# Patient Record
Sex: Female | Born: 1985 | Race: Black or African American | Hispanic: No | Marital: Single | Smoking: Never smoker
Health system: Southern US, Community
[De-identification: ages and names within clinical notes are randomized; demographics above are authoritative.]

## PROBLEM LIST (undated history)

## (undated) DIAGNOSIS — N12 Tubulo-interstitial nephritis, not specified as acute or chronic: Secondary | ICD-10-CM

## (undated) DIAGNOSIS — M67439 Ganglion, unspecified wrist: Secondary | ICD-10-CM

## (undated) DIAGNOSIS — E46 Unspecified protein-calorie malnutrition: Secondary | ICD-10-CM

## (undated) DIAGNOSIS — N179 Acute kidney failure, unspecified: Secondary | ICD-10-CM

## (undated) DIAGNOSIS — J90 Pleural effusion, not elsewhere classified: Secondary | ICD-10-CM

## (undated) DIAGNOSIS — D696 Thrombocytopenia, unspecified: Secondary | ICD-10-CM

---
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
## 2009-03-25 ENCOUNTER — Encounter: Admission: RE | Admit: 2009-03-25 | Discharge: 2009-03-25 | Payer: Self-pay | Admitting: Family Medicine

## 2009-08-18 ENCOUNTER — Encounter: Admission: RE | Admit: 2009-08-18 | Discharge: 2009-08-18 | Payer: Self-pay | Admitting: Family Medicine

## 2011-03-17 DIAGNOSIS — M67439 Ganglion, unspecified wrist: Secondary | ICD-10-CM

## 2011-03-17 HISTORY — DX: Ganglion, unspecified wrist: M67.439

## 2011-04-13 ENCOUNTER — Other Ambulatory Visit: Payer: Self-pay | Admitting: Orthopedic Surgery

## 2011-04-13 ENCOUNTER — Encounter (HOSPITAL_BASED_OUTPATIENT_CLINIC_OR_DEPARTMENT_OTHER): Payer: Self-pay | Admitting: *Deleted

## 2011-04-20 ENCOUNTER — Encounter (HOSPITAL_BASED_OUTPATIENT_CLINIC_OR_DEPARTMENT_OTHER): Payer: Self-pay | Admitting: *Deleted

## 2011-04-20 ENCOUNTER — Encounter (HOSPITAL_BASED_OUTPATIENT_CLINIC_OR_DEPARTMENT_OTHER): Payer: Self-pay | Admitting: Orthopedic Surgery

## 2011-04-20 ENCOUNTER — Encounter (HOSPITAL_BASED_OUTPATIENT_CLINIC_OR_DEPARTMENT_OTHER): Payer: Self-pay | Admitting: Anesthesiology

## 2011-04-20 ENCOUNTER — Encounter (HOSPITAL_BASED_OUTPATIENT_CLINIC_OR_DEPARTMENT_OTHER)
Admission: RE | Disposition: A | Discharge status: Home / Self Care | Payer: Self-pay | Source: Ambulatory Visit | Attending: Orthopedic Surgery

## 2011-04-20 ENCOUNTER — Ambulatory Visit (HOSPITAL_BASED_OUTPATIENT_CLINIC_OR_DEPARTMENT_OTHER): Payer: BC Managed Care – PPO | Admitting: Anesthesiology

## 2011-04-20 ENCOUNTER — Ambulatory Visit (HOSPITAL_BASED_OUTPATIENT_CLINIC_OR_DEPARTMENT_OTHER)
Admission: RE | Admit: 2011-04-20 | Discharge: 2011-04-20 | Disposition: A | Discharge status: Home / Self Care | Payer: BC Managed Care – PPO | Source: Ambulatory Visit | Attending: Orthopedic Surgery | Admitting: Orthopedic Surgery

## 2011-04-20 DIAGNOSIS — M6740 Ganglion, unspecified site: Secondary | ICD-10-CM | POA: Insufficient documentation

## 2011-04-20 HISTORY — PX: GANGLION CYST EXCISION: SHX1691

## 2011-04-20 HISTORY — DX: Ganglion, unspecified wrist: M67.439

## 2011-04-20 LAB — POCT HEMOGLOBIN-HEMACUE: Hemoglobin: 12.2 g/dL (ref 12.0–15.0)

## 2011-04-20 SURGERY — EXCISION, GANGLION CYST, WRIST
Anesthesia: General | Site: Wrist | Laterality: Right | Wound class: Clean

## 2011-04-20 MED ORDER — LACTATED RINGERS IV SOLN
INTRAVENOUS | Status: DC
  Administered 2011-04-20 (×2): via INTRAVENOUS

## 2011-04-20 MED ORDER — PROPOFOL 10 MG/ML IV EMUL
INTRAVENOUS | Status: DC | PRN
  Administered 2011-04-20: 200 mg via INTRAVENOUS

## 2011-04-20 MED ORDER — ONDANSETRON HCL 4 MG/2ML IJ SOLN
INTRAMUSCULAR | Status: DC | PRN
  Administered 2011-04-20: 4 mg via INTRAVENOUS

## 2011-04-20 MED ORDER — CHLORHEXIDINE GLUCONATE 4 % EX LIQD: 60.0000 mL | Freq: Once | CUTANEOUS | Status: DC

## 2011-04-20 MED ORDER — MIDAZOLAM HCL 5 MG/5ML IJ SOLN
INTRAMUSCULAR | Status: DC | PRN
  Administered 2011-04-20: 2 mg via INTRAVENOUS

## 2011-04-20 MED ORDER — LIDOCAINE HCL (CARDIAC) 20 MG/ML IV SOLN
INTRAVENOUS | Status: DC | PRN
  Administered 2011-04-20: 50 mg via INTRAVENOUS

## 2011-04-20 MED ORDER — HYDROMORPHONE HCL PF 1 MG/ML IJ SOLN
0.2500 mg | INTRAMUSCULAR | Status: DC | PRN
  Administered 2011-04-20 (×3): 0.5 mg via INTRAVENOUS

## 2011-04-20 MED ORDER — FENTANYL CITRATE 0.05 MG/ML IJ SOLN
INTRAMUSCULAR | Status: DC | PRN
  Administered 2011-04-20 (×2): 50 ug via INTRAVENOUS

## 2011-04-20 MED ORDER — HYDROCODONE-ACETAMINOPHEN 5-325 MG PO TABS: ORAL_TABLET | ORAL | Status: AC

## 2011-04-20 MED ORDER — LIDOCAINE HCL 2 % IJ SOLN
INTRAMUSCULAR | Status: DC | PRN
  Administered 2011-04-20: 5 mL

## 2011-04-20 MED ORDER — DEXAMETHASONE SODIUM PHOSPHATE 4 MG/ML IJ SOLN
INTRAMUSCULAR | Status: DC | PRN
  Administered 2011-04-20: 10 mg via INTRAVENOUS

## 2011-04-20 MED ORDER — ONDANSETRON HCL 4 MG/2ML IJ SOLN: 4.0000 mg | Freq: Once | INTRAMUSCULAR | Status: DC | PRN

## 2011-04-20 SURGICAL SUPPLY — 48 items
BANDAGE ADHESIVE 1X3 (GAUZE/BANDAGES/DRESSINGS) IMPLANT
BANDAGE ELASTIC 3 VELCRO ST LF (GAUZE/BANDAGES/DRESSINGS) ×2 IMPLANT
BANDAGE GAUZE ELAST BULKY 4 IN (GAUZE/BANDAGES/DRESSINGS) IMPLANT
BLADE MINI RND TIP GREEN BEAV (BLADE) IMPLANT
BLADE SURG 15 STRL LF DISP TIS (BLADE) ×1 IMPLANT
BLADE SURG 15 STRL SS (BLADE) ×1
BNDG ESMARK 4X9 LF (GAUZE/BANDAGES/DRESSINGS) ×2 IMPLANT
BRUSH SCRUB EZ PLAIN DRY (MISCELLANEOUS) ×2 IMPLANT
CLOTH BEACON ORANGE TIMEOUT ST (SAFETY) ×2 IMPLANT
CORDS BIPOLAR (ELECTRODE) ×2 IMPLANT
COVER MAYO STAND STRL (DRAPES) ×2 IMPLANT
COVER TABLE BACK 60X90 (DRAPES) ×2 IMPLANT
CUFF TOURNIQUET SINGLE 18IN (TOURNIQUET CUFF) ×2 IMPLANT
DECANTER SPIKE VIAL GLASS SM (MISCELLANEOUS) IMPLANT
DRAPE EXTREMITY T 121X128X90 (DRAPE) ×2 IMPLANT
DRAPE SURG 17X23 STRL (DRAPES) ×2 IMPLANT
GAUZE SPONGE 4X4 12PLY STRL LF (GAUZE/BANDAGES/DRESSINGS) ×2 IMPLANT
GLOVE BIOGEL M STRL SZ7.5 (GLOVE) ×2 IMPLANT
GLOVE BIOGEL PI IND STRL 7.0 (GLOVE) ×1 IMPLANT
GLOVE BIOGEL PI INDICATOR 7.0 (GLOVE) ×1
GLOVE ECLIPSE 6.5 STRL STRAW (GLOVE) ×2 IMPLANT
GLOVE ORTHO TXT STRL SZ7.5 (GLOVE) ×2 IMPLANT
GLOVE SKINSENSE NS SZ6.5 (GLOVE) ×1
GLOVE SKINSENSE STRL SZ6.5 (GLOVE) ×1 IMPLANT
GOWN PREVENTION PLUS XLARGE (GOWN DISPOSABLE) ×4 IMPLANT
GOWN PREVENTION PLUS XXLARGE (GOWN DISPOSABLE) ×4 IMPLANT
NEEDLE 27GAX1X1/2 (NEEDLE) ×2 IMPLANT
PACK BASIN DAY SURGERY FS (CUSTOM PROCEDURE TRAY) ×2 IMPLANT
PAD CAST 3X4 CTTN HI CHSV (CAST SUPPLIES) ×1 IMPLANT
PAD CAST 4YDX4 CTTN HI CHSV (CAST SUPPLIES) ×1 IMPLANT
PADDING CAST ABS 4INX4YD NS (CAST SUPPLIES) ×1
PADDING CAST ABS COTTON 4X4 ST (CAST SUPPLIES) ×1 IMPLANT
PADDING CAST COTTON 3X4 STRL (CAST SUPPLIES) ×1
PADDING CAST COTTON 4X4 STRL (CAST SUPPLIES) ×1
SPLINT PLASTER CAST XFAST 3X15 (CAST SUPPLIES) ×4 IMPLANT
SPLINT PLASTER XTRA FASTSET 3X (CAST SUPPLIES) ×4
SPONGE GAUZE 4X4 12PLY (GAUZE/BANDAGES/DRESSINGS) ×2 IMPLANT
STOCKINETTE 4X48 STRL (DRAPES) ×2 IMPLANT
STRIP CLOSURE SKIN 1/2X4 (GAUZE/BANDAGES/DRESSINGS) ×2 IMPLANT
SUT PROLENE 3 0 PS 2 (SUTURE) ×2 IMPLANT
SUT VIC AB 4-0 P-3 18XBRD (SUTURE) ×1 IMPLANT
SUT VIC AB 4-0 P3 18 (SUTURE) ×1
SYR 3ML 23GX1 SAFETY (SYRINGE) IMPLANT
SYR CONTROL 10ML LL (SYRINGE) ×2 IMPLANT
TOWEL OR 17X24 6PK STRL BLUE (TOWEL DISPOSABLE) ×4 IMPLANT
TRAY DSU PREP LF (CUSTOM PROCEDURE TRAY) ×2 IMPLANT
UNDERPAD 30X30 INCONTINENT (UNDERPADS AND DIAPERS) ×2 IMPLANT
WATER STERILE IRR 1000ML POUR (IV SOLUTION) ×2 IMPLANT

## 2011-04-20 NOTE — Brief Op Note (Signed)
04/20/2011  11:45 AM  PATIENT:  Danielle [name]  26 y.o. female  PRE-OPERATIVE DIAGNOSIS:  right dorsal ganglion cyst  POST-OPERATIVE DIAGNOSIS:  right dorsal ganglion cyst  PROCEDURE: REMOVAL OF MYXOID CYST OF RIGHT DORSAL WRIST WITH DEBRIDEMENT OF SCAPHOLUNATE LIGAMENT  SURGEON:  Wyn Forster., MD   PHYSICIAN ASSISTANT:   ASSISTANTS: Mallory Shirk.A-C    ANESTHESIA:   general  EBL:  Total I/O In: 1000 [I.V.:1000] Out: -   BLOOD ADMINISTERED:none  DRAINS: none   LOCAL MEDICATIONS USED:  XYLOCAINE   SPECIMEN:  none  DISPOSITION OF SPECIMEN:  N/A  COUNTS:  YES  TOURNIQUET:  * Missing tourniquet times found for documented tourniquets in log:  [id number] *  DICTATION: .Other Dictation: Dictation Number [id number]  PLAN OF CARE: Discharge to home after PACU  PATIENT DISPOSITION:  PACU - hemodynamically stable.

## 2011-04-20 NOTE — Discharge Instructions (Signed)

## 2011-04-20 NOTE — Op Note (Signed)
Op note dictated: 04/20/11 [id number]

## 2011-04-20 NOTE — Anesthesia Postprocedure Evaluation (Signed)
Anesthesia Post Note  Patient: Danielle [name]  Procedure(s) Performed: Procedure(s) (LRB): REMOVAL GANGLION OF WRIST (Right)  Anesthesia type: general  Patient location: PACU  Post pain: Pain level controlled  Post assessment: Patient's Cardiovascular Status Stable  Last Vitals:  Filed Vitals:   04/20/11 1300  BP: 102/66  Pulse: 74  Temp:   Resp: 12    Post vital signs: Reviewed and stable  Level of consciousness: sedated  Complications: No apparent anesthesia complications

## 2011-04-20 NOTE — Transfer of Care (Signed)
Immediate Anesthesia Transfer of Care Note  Patient: Danielle [name]  Procedure(s) Performed: Procedure(s) (LRB): REMOVAL GANGLION OF WRIST (Right)  Patient Location: PACU  Anesthesia Type: General  Level of Consciousness: awake and alert   Airway & Oxygen Therapy: Patient Spontanous Breathing and Patient connected to face mask oxygen  Post-op Assessment: Report given to PACU RN and Post -op Vital signs reviewed and stable  Post vital signs: Reviewed and stable  Complications: No apparent anesthesia complications

## 2011-04-20 NOTE — Anesthesia Preprocedure Evaluation (Signed)
Anesthesia Evaluation  Patient identified by MRN, date of birth, ID band Patient awake    Reviewed: Allergy & Precautions, H&P , NPO status , Patient's Chart, lab work & pertinent test results  Airway Mallampati: I TM Distance: >3 FB Neck ROM: full    Dental   Pulmonary          Cardiovascular     Neuro/Psych    GI/Hepatic   Endo/Other    Renal/GU      Musculoskeletal   Abdominal   Peds  Hematology   Anesthesia Other Findings   Reproductive/Obstetrics                          Anesthesia Physical Anesthesia Plan  ASA: I  Anesthesia Plan: General LMA   Post-op Pain Management:    Induction:   Airway Management Planned:   Additional Equipment:   Intra-op Plan:   Post-operative Plan:   Informed Consent: I have reviewed the patients History and Physical, chart, labs and discussed the procedure including the risks, benefits and alternatives for the proposed anesthesia with the patient or authorized representative who has indicated his/her understanding and acceptance.     Plan Discussed with: CRNA and Surgeon  Anesthesia Plan Comments:         Anesthesia Quick Evaluation  

## 2011-04-20 NOTE — H&P (Signed)
  Danielle [name] is an 26 y.o. female.   Chief Complaint: c/o mass dorsal aspect right wrist. HPI: Danielle [name] is a 26 year-old Danielle [name] at [employer].  She is left-hand dominant.  For four years she has had the mass on the dorsal aspect of her right wrist overlying her scapholunate region. 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  She now seeks an upper extremity orthopaedic consult due to mechanical symptoms at her cyst.    Past Medical History  Diagnosis Date  . Dorsal wrist ganglion 03/2011    right    History reviewed. No pertinent past surgical history.  History reviewed. No pertinent family history. Social History:  reports that she has never smoked. She has never used smokeless tobacco. She reports that she drinks alcohol. She reports that she does not use illicit drugs.  Allergies: No Known Allergies  No current facility-administered medications on file as of .   No current outpatient prescriptions on file as of .    No results found for this or any previous visit (from the past 48 hour(s)).  No results found.   Pertinent items are noted in HPI.  Height 6' (1.829 m), weight 61.236 kg (135 lb), last menstrual period 03/18/2011.  General appearance: alert Head: Normocephalic, without obvious abnormality Neck: supple, symmetrical, trachea midline Resp: clear to auscultation bilaterally Cardio: regular rate and rhythm, S1, S2 normal, no murmur, click, rub or gallop GI: normal findings: bowel sounds normal Extremities:.  Inspection of her hands and arms reveals a 1.5 cm. mobile subcutaneous myxoid  cyst underlying the right dorsal scapholunate ligament.  She has full range of motion of her wrist and fingers.  Pulse and cap refill are intact.  She has no sign of stenosing tenosynovitis.  Her motor and sensory examination is normal.    Three views of her right wrist AP, lateral and oblique demonstrate normal bony anatomy.  She is ulnar neutral.     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   PLAN:  We had a detailed, informed consent.  I advised her to live with this for as long as she choses to do so.  We discussed the spectrum of treatment options which include rupture, aspiration and excision.  She would like to have this removed.  We will schedule this at a mutually convenient time on an outpatient basis at the [facility] facility.    DASNOIT,Silvanna Ohmer J 04/20/2011, 8:29 AM   H&P documentation: 04/20/2011  -History and Physical Reviewed  -Patient has been re-examined  -No change in the plan of care  Wyn Forster, MD

## 2011-04-20 NOTE — Anesthesia Procedure Notes (Signed)
Procedure Name: LMA Insertion Date/Time: 04/20/2011 11:13 AM Performed by: Caren Macadam Pre-anesthesia Checklist: Patient identified, Emergency Drugs available, Suction available and Patient being monitored Patient Re-evaluated:Patient Re-evaluated prior to inductionOxygen Delivery Method: Circle System Utilized Preoxygenation: Pre-oxygenation with 100% oxygen Intubation Type: IV induction Ventilation: Mask ventilation without difficulty LMA: LMA inserted LMA Size: 4.0 Number of attempts: 1 Airway Equipment and Method: bite block Placement Confirmation: positive ETCO2 Tube secured with: Tape Dental Injury: Teeth and Oropharynx as per pre-operative assessment

## 2011-04-21 ENCOUNTER — Encounter (HOSPITAL_BASED_OUTPATIENT_CLINIC_OR_DEPARTMENT_OTHER): Payer: Self-pay | Admitting: Orthopedic Surgery

## 2011-04-21 NOTE — Op Note (Signed)
NAME:  [name], Danielle                 ACCOUNT NO.:  MEDICAL RECORD NO.:  [id number]  LOCATION:                                 FACILITY:  PHYSICIAN:  Danielle Fitch. Jushua Waltman, M.D.      DATE OF BIRTH:  DATE OF PROCEDURE:  04/20/2011 DATE OF DISCHARGE:                              OPERATIVE REPORT   PREOPERATIVE DIAGNOSIS:  Painful enlarging myxoid cyst dorsal aspect of right wrist.  POSTOPERATIVE DIAGNOSIS:  Dorsal myxoid cyst extending from the scapholunate ligament.  OPERATION:  Resection of dorsal myxoid cyst with debridement of dorsal surface of scapholunate ligament and electrocautery of origin.  OPERATING SURGEON:  Danielle Fitch. Manjot Hinks, M.D.  ASSISTANT:  Marveen Reeks. Dasnoit, PA-C.  ANESTHESIA:  General by LMA.  SUPERVISING ANESTHESIOLOGIST:  Kaylyn Layer. Michelle Piper, M.D.  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 Elias Else for evaluation and management of a large mass on the dorsal aspect of the right wrist.  Clinical examination revealed signs of a probable myxoid cyst originating at the scapholunate ligament.  We advised Danielle [name] that this is a benign process that could be left alone for many years.  She has lived with this for years, but has pain with dorsiflexion and load-bearing.  Clinical examination revealed no sign of carpal instability.  Plain x-rays were normal.  We advised that we could excise the cyst; however, she understands that the biology of these myxoid cyst is complex and likely degenerative in nature.  She understands that despite our best efforts of its eradication, there is still small chance a new cyst will form over time.  At the present time, she would like to have the current cyst excised for symptom relief.  PROCEDURE:  Danielle [name] was brought to room 1 of the [facility] and placed in supine position on the operating table.  Following the induction of general anesthesia by LMA technique under  Dr. Deirdre Priest strict supervision, the right arm was prepped with Betadine soap and solution, sterilely draped.  Following routine surgical time-out, the arm was exsanguinated with Esmarch bandage and an arterial tourniquet was inflated to 220 mmHg.  Procedure commenced with a short transverse incision directly over the mass.  Subcutaneous tissues were carefully divided, taken care to identify the extensor retinaculum. This was split in line of its fibers, followed by meticulous dissection of the cyst circumferentially.  After the cyst was followed down to the capsule proper, it was drained off its contents and the entire cyst and its membrane excised.  This extended to the scapholunate ligament dorsal surface.  This was debrided with a micro rongeur of its superficial fibers.  The origin of the cyst was then electrocauterized with bipolar forceps.  Bleeding points were electrocauterized followed by repair of the wound with subcutaneous suture of 4-0 Vicryl and intradermal 3-0 Prolene with Steri-Strips.  Compressive dressings applied with a volar plaster splint maintaining the wrist in 10 degrees of dorsiflexion.  For aftercare, Danielle [name] is provided a prescription for hydrocodone 5 mg 1 p.o. q.4 hours p.r.n. pain, 20 tablets, without refill.     Danielle [name], M.D.  RVS/MEDQ  D:  04/20/2011  T:  04/20/2011  Job:  [id number]  cc:   Molly Maduro A. Nicholos Johns, M.D.

## 2013-10-16 DIAGNOSIS — D696 Thrombocytopenia, unspecified: Secondary | ICD-10-CM

## 2013-10-16 DIAGNOSIS — N12 Tubulo-interstitial nephritis, not specified as acute or chronic: Secondary | ICD-10-CM

## 2013-10-16 DIAGNOSIS — E46 Unspecified protein-calorie malnutrition: Secondary | ICD-10-CM

## 2013-10-16 HISTORY — DX: Unspecified protein-calorie malnutrition: E46

## 2013-10-16 HISTORY — DX: Tubulo-interstitial nephritis, not specified as acute or chronic: N12

## 2013-10-16 HISTORY — DX: Thrombocytopenia, unspecified: D69.6

## 2013-10-30 DIAGNOSIS — J90 Pleural effusion, not elsewhere classified: Secondary | ICD-10-CM

## 2013-10-30 HISTORY — DX: Pleural effusion, not elsewhere classified: J90

## 2013-11-03 ENCOUNTER — Other Ambulatory Visit: Payer: Self-pay | Admitting: Family Medicine

## 2013-11-03 DIAGNOSIS — R1011 Right upper quadrant pain: Secondary | ICD-10-CM

## 2013-11-04 ENCOUNTER — Ambulatory Visit
Admission: RE | Admit: 2013-11-04 | Discharge: 2013-11-04 | Disposition: A | Discharge status: Home / Self Care | Payer: BC Managed Care – PPO | Source: Ambulatory Visit | Attending: Family Medicine | Admitting: Family Medicine

## 2013-11-04 DIAGNOSIS — R1011 Right upper quadrant pain: Secondary | ICD-10-CM

## 2013-11-20 ENCOUNTER — Inpatient Hospital Stay (HOSPITAL_COMMUNITY)
Admission: EM | Admit: 2013-11-20 | Discharge: 2013-11-26 | DRG: 690 | Disposition: A | Discharge status: Home / Self Care | Payer: BC Managed Care – PPO | Attending: Internal Medicine | Admitting: Internal Medicine

## 2013-11-20 ENCOUNTER — Encounter (HOSPITAL_COMMUNITY): Payer: Self-pay | Admitting: *Deleted

## 2013-11-20 ENCOUNTER — Emergency Department (HOSPITAL_COMMUNITY): Payer: BC Managed Care – PPO

## 2013-11-20 DIAGNOSIS — K209 Esophagitis, unspecified without bleeding: Secondary | ICD-10-CM

## 2013-11-20 DIAGNOSIS — R824 Acetonuria: Secondary | ICD-10-CM | POA: Diagnosis present

## 2013-11-20 DIAGNOSIS — K219 Gastro-esophageal reflux disease without esophagitis: Secondary | ICD-10-CM | POA: Diagnosis present

## 2013-11-20 DIAGNOSIS — K210 Gastro-esophageal reflux disease with esophagitis: Secondary | ICD-10-CM | POA: Diagnosis present

## 2013-11-20 DIAGNOSIS — R319 Hematuria, unspecified: Secondary | ICD-10-CM | POA: Diagnosis present

## 2013-11-20 DIAGNOSIS — N12 Tubulo-interstitial nephritis, not specified as acute or chronic: Secondary | ICD-10-CM | POA: Diagnosis present

## 2013-11-20 DIAGNOSIS — R109 Unspecified abdominal pain: Secondary | ICD-10-CM

## 2013-11-20 DIAGNOSIS — R112 Nausea with vomiting, unspecified: Secondary | ICD-10-CM | POA: Insufficient documentation

## 2013-11-20 DIAGNOSIS — Z79899 Other long term (current) drug therapy: Secondary | ICD-10-CM

## 2013-11-20 DIAGNOSIS — A419 Sepsis, unspecified organism: Secondary | ICD-10-CM | POA: Diagnosis present

## 2013-11-20 DIAGNOSIS — E860 Dehydration: Secondary | ICD-10-CM | POA: Diagnosis present

## 2013-11-20 DIAGNOSIS — D649 Anemia, unspecified: Secondary | ICD-10-CM | POA: Diagnosis not present

## 2013-11-20 DIAGNOSIS — N10 Acute tubulo-interstitial nephritis: Secondary | ICD-10-CM

## 2013-11-20 HISTORY — DX: Acute kidney failure, unspecified: N17.9

## 2013-11-20 HISTORY — DX: Tubulo-interstitial nephritis, not specified as acute or chronic: N12

## 2013-11-20 HISTORY — DX: Unspecified protein-calorie malnutrition: E46

## 2013-11-20 HISTORY — DX: Thrombocytopenia, unspecified: D69.6

## 2013-11-20 HISTORY — DX: Pleural effusion, not elsewhere classified: J90

## 2013-11-20 LAB — COMPREHENSIVE METABOLIC PANEL
ALBUMIN: 3.5 g/dL (ref 3.5–5.2)
ALT: 17 U/L (ref 0–35)
ALT: 16 U/L (ref 0–35)
ANION GAP: 15 (ref 5–15)
AST: 23 U/L (ref 0–37)
AST: 22 U/L (ref 0–37)
Albumin: 4.1 g/dL (ref 3.5–5.2)
Alkaline Phosphatase: 68 U/L (ref 39–117)
Alkaline Phosphatase: 59 U/L (ref 39–117)
Anion gap: 15 (ref 5–15)
BUN: 5 mg/dL — AB (ref 6–23)
BUN: 5 mg/dL — ABNORMAL LOW (ref 6–23)
CALCIUM: 9.3 mg/dL (ref 8.4–10.5)
CO2: 23 meq/L (ref 19–32)
CO2: 21 mEq/L (ref 19–32)
CREATININE: 0.72 mg/dL (ref 0.50–1.10)
Calcium: 8.3 mg/dL — ABNORMAL LOW (ref 8.4–10.5)
Chloride: 100 mEq/L (ref 96–112)
Chloride: 104 mEq/L (ref 96–112)
Creatinine, Ser: 0.72 mg/dL (ref 0.50–1.10)
GFR calc Af Amer: >90 mL/min (ref >90)
GFR calc non Af Amer: >90 mL/min (ref >90)
GLUCOSE: 100 mg/dL — AB (ref 70–99)
Glucose, Bld: 91 mg/dL (ref 70–99)
POTASSIUM: 4.1 meq/L (ref 3.7–5.3)
Potassium: 3.6 mEq/L — ABNORMAL LOW (ref 3.7–5.3)
SODIUM: 140 meq/L (ref 137–147)
Sodium: 138 mEq/L (ref 137–147)
TOTAL PROTEIN: 8 g/dL (ref 6.0–8.3)
Total Bilirubin: 1.2 mg/dL (ref 0.3–1.2)
Total Bilirubin: 0.7 mg/dL (ref 0.3–1.2)
Total Protein: 7.1 g/dL (ref 6.0–8.3)

## 2013-11-20 LAB — CBC WITH DIFFERENTIAL/PLATELET
Basophils Absolute: 0 10*3/uL (ref 0.0–0.1)
Basophils Relative: 0 % (ref 0–1)
EOS ABS: 0 10*3/uL (ref 0.0–0.7)
EOS PCT: 0 % (ref 0–5)
HEMATOCRIT: 35.6 % — AB (ref 36.0–46.0)
Hemoglobin: 12.2 g/dL (ref 12.0–15.0)
LYMPHS ABS: 1.1 10*3/uL (ref 0.7–4.0)
LYMPHS PCT: 11 % — AB (ref 12–46)
MCH: 30.3 pg (ref 26.0–34.0)
MCHC: 34.3 g/dL (ref 30.0–36.0)
MCV: 88.6 fL (ref 78.0–100.0)
MONO ABS: 1.2 10*3/uL — AB (ref 0.1–1.0)
MONOS PCT: 12 % (ref 3–12)
Neutro Abs: 7.6 10*3/uL (ref 1.7–7.7)
Neutrophils Relative %: 77 % (ref 43–77)
PLATELETS: 179 10*3/uL (ref 150–400)
RBC: 4.02 MIL/uL (ref 3.87–5.11)
RDW: 12.5 % (ref 11.5–15.5)
WBC: 9.9 10*3/uL (ref 4.0–10.5)

## 2013-11-20 LAB — CBC
HEMATOCRIT: 35.3 % — AB (ref 36.0–46.0)
HEMOGLOBIN: 11.5 g/dL — AB (ref 12.0–15.0)
MCH: 29.6 pg (ref 26.0–34.0)
MCHC: 32.6 g/dL (ref 30.0–36.0)
MCV: 91 fL (ref 78.0–100.0)
Platelets: 156 10*3/uL (ref 150–400)
RBC: 3.88 MIL/uL (ref 3.87–5.11)
RDW: 12.6 % (ref 11.5–15.5)
WBC: 7.7 10*3/uL (ref 4.0–10.5)

## 2013-11-20 LAB — URINE MICROSCOPIC-ADD ON

## 2013-11-20 LAB — URINALYSIS, ROUTINE W REFLEX MICROSCOPIC
Bilirubin Urine: NEGATIVE
Glucose, UA: NEGATIVE mg/dL
Ketones, ur: 40 mg/dL — AB
NITRITE: NEGATIVE
PROTEIN: 100 mg/dL — AB
SPECIFIC GRAVITY, URINE: 1.015 (ref 1.005–1.030)
UROBILINOGEN UA: 0.2 mg/dL (ref 0.0–1.0)
pH: 7.5 (ref 5.0–8.0)

## 2013-11-20 LAB — PROTIME-INR
INR: 1.13 (ref 0.00–1.49)
Prothrombin Time: 14.6 seconds (ref 11.6–15.2)

## 2013-11-20 LAB — PREGNANCY, URINE: PREG TEST UR: NEGATIVE

## 2013-11-20 LAB — LACTIC ACID, PLASMA: Lactic Acid, Venous: 1.4 mmol/L (ref 0.5–2.2)

## 2013-11-20 MED ORDER — FAMOTIDINE 20 MG PO TABS: 20.0000 mg | ORAL_TABLET | Freq: Two times a day (BID) | ORAL | Status: DC

## 2013-11-20 MED ORDER — ACETAMINOPHEN 650 MG RE SUPP: 650.0000 mg | Freq: Four times a day (QID) | RECTAL | Status: DC | PRN

## 2013-11-20 MED ORDER — PIPERACILLIN-TAZOBACTAM 3.375 G IVPB
3.3750 g | Freq: Three times a day (TID) | INTRAVENOUS | Status: DC
  Administered 2013-11-20 – 2013-11-24 (×13): 3.375 g via INTRAVENOUS
  Filled 2013-11-20 (×14): qty 50

## 2013-11-20 MED ORDER — GI COCKTAIL ~~LOC~~
30.0000 mL | ORAL | Status: DC | PRN
  Administered 2013-11-20 (×2): 30 mL via ORAL
  Filled 2013-11-20 (×3): qty 30

## 2013-11-20 MED ORDER — ONDANSETRON HCL 4 MG/2ML IJ SOLN
4.0000 mg | Freq: Once | INTRAMUSCULAR | Status: AC
  Administered 2013-11-20: 4 mg via INTRAVENOUS
  Filled 2013-11-20: qty 2

## 2013-11-20 MED ORDER — FENTANYL CITRATE 0.05 MG/ML IJ SOLN
25.0000 ug | INTRAMUSCULAR | Status: DC | PRN
  Administered 2013-11-20 (×4): 25 ug via INTRAVENOUS
  Filled 2013-11-20 (×4): qty 2

## 2013-11-20 MED ORDER — SODIUM CHLORIDE 0.9 % IV BOLUS (SEPSIS)
1000.0000 mL | Freq: Once | INTRAVENOUS | Status: AC
  Administered 2013-11-20: 1000 mL via INTRAVENOUS

## 2013-11-20 MED ORDER — ONDANSETRON HCL 4 MG PO TABS: 4.0000 mg | ORAL_TABLET | Freq: Four times a day (QID) | ORAL | Status: DC | PRN

## 2013-11-20 MED ORDER — ACETAMINOPHEN 325 MG PO TABS
650.0000 mg | ORAL_TABLET | Freq: Four times a day (QID) | ORAL | Status: DC | PRN
  Administered 2013-11-23 – 2013-11-25 (×2): 650 mg via ORAL
  Filled 2013-11-20 (×2): qty 2

## 2013-11-20 MED ORDER — HYOSCYAMINE SULFATE 0.125 MG PO TABS
0.1250 mg | ORAL_TABLET | Freq: Four times a day (QID) | ORAL | Status: DC | PRN
  Filled 2013-11-20: qty 1

## 2013-11-20 MED ORDER — MORPHINE SULFATE 4 MG/ML IJ SOLN
4.0000 mg | Freq: Once | INTRAMUSCULAR | Status: AC
  Administered 2013-11-20: 4 mg via INTRAVENOUS
  Filled 2013-11-20: qty 1

## 2013-11-20 MED ORDER — DEXTROSE 5 % IV SOLN
1.0000 g | Freq: Once | INTRAVENOUS | Status: AC
  Administered 2013-11-20: 1 g via INTRAVENOUS
  Filled 2013-11-20: qty 10

## 2013-11-20 MED ORDER — ONDANSETRON 4 MG PO TBDP
8.0000 mg | ORAL_TABLET | Freq: Once | ORAL | Status: AC
  Administered 2013-11-20: 8 mg via ORAL
  Filled 2013-11-20: qty 2

## 2013-11-20 MED ORDER — FAMOTIDINE IN NACL 20-0.9 MG/50ML-% IV SOLN
20.0000 mg | Freq: Two times a day (BID) | INTRAVENOUS | Status: DC
  Administered 2013-11-20 – 2013-11-23 (×7): 20 mg via INTRAVENOUS
  Filled 2013-11-20 (×8): qty 50

## 2013-11-20 MED ORDER — ENOXAPARIN SODIUM 40 MG/0.4ML ~~LOC~~ SOLN
40.0000 mg | SUBCUTANEOUS | Status: DC
  Administered 2013-11-20 – 2013-11-26 (×7): 40 mg via SUBCUTANEOUS
  Filled 2013-11-20 (×11): qty 0.4

## 2013-11-20 MED ORDER — GI COCKTAIL ~~LOC~~
30.0000 mL | Freq: Once | ORAL | Status: AC
  Administered 2013-11-20: 30 mL via ORAL
  Filled 2013-11-20: qty 30

## 2013-11-20 MED ORDER — SODIUM CHLORIDE 0.9 % IJ SOLN
3.0000 mL | Freq: Two times a day (BID) | INTRAMUSCULAR | Status: DC
  Administered 2013-11-20 – 2013-11-26 (×6): 3 mL via INTRAVENOUS
  Filled 2013-11-20: qty 3

## 2013-11-20 MED ORDER — OXYCODONE HCL 5 MG PO TABS
5.0000 mg | ORAL_TABLET | ORAL | Status: DC | PRN
  Administered 2013-11-20 – 2013-11-26 (×8): 5 mg via ORAL
  Filled 2013-11-20 (×9): qty 1

## 2013-11-20 MED ORDER — SODIUM CHLORIDE 0.9 % IV SOLN
INTRAVENOUS | Status: DC
  Administered 2013-11-20 – 2013-11-25 (×15): via INTRAVENOUS

## 2013-11-20 MED ORDER — ONDANSETRON HCL 4 MG/2ML IJ SOLN
4.0000 mg | Freq: Four times a day (QID) | INTRAMUSCULAR | Status: DC | PRN
  Administered 2013-11-20 – 2013-11-26 (×7): 4 mg via INTRAVENOUS
  Filled 2013-11-20 (×7): qty 2

## 2013-11-20 NOTE — ED Notes (Signed)
BED PLACEMENT NOTIFIED THAT PT. WILL BE UPGRADED TO A STEP-DOWN UNIT PER DR. PATEL.

## 2013-11-20 NOTE — ED Notes (Signed)
She was given  Antibiotics by her doctor today but she has been unable to keep any of her meds or food down

## 2013-11-20 NOTE — ED Notes (Addendum)
Patient is resting comfortably. Family at bedside.  

## 2013-11-20 NOTE — ED Notes (Signed)
[facility]

## 2013-11-20 NOTE — ED Notes (Signed)
Pt transported to radiology.

## 2013-11-20 NOTE — ED Notes (Signed)
Patient is resting comfortably. 

## 2013-11-20 NOTE — H&P (Signed)
[facility] History and Physical  Patient: Danielle [name]  XBJ:[id number]  DOB: Mar 17, 1985  DOS: the patient was seen and examined on 11/20/2013 PCP: Lolita PatellaEADE,ROBERT ALEXANDER, MD  Chief Complaint: fever with chills and abdominal pain  HPI: Danielle [name] is a 28 y.o. female with Past medical history of right pyelonephritis. The patient presented with complaints of right-sided flank pain and fever and chills that has been ongoing since today on 11/19/2013 progressively worsening. She also had nausea and vomiting. With this she went to see her PCP who gave her Cipro and Augmentin but due to recurrent nausea and vomiting she was unable to tolerate anything orally and therefore decided to come to the ER. She denies any vaginal discharge diarrhea constipation chest pain shortness of breath. She denies any cough. She denies any rash. She denies any recent procedure urological. She denies any high risk sexual behavior, or intrauterine devices.  The patient is coming from home. And at her baseline independent for most of her ADL.  Review of Systems: as mentioned in the history of present illness.  A Comprehensive review of the other systems is negative.  Past Medical History  Diagnosis Date  . Dorsal wrist ganglion 03/2011    right   Past Surgical History  Procedure Laterality Date  . Ganglion cyst excision  04/20/2011    Procedure: REMOVAL GANGLION OF WRIST;  Surgeon: Wyn Forsterobert V Sypher Jr., MD;  Location: [facility];  Service: Orthopedics;  Laterality: Right;  right dorsal wrist   Social History:  reports that she has never smoked. She has never used smokeless tobacco. She reports that she drinks alcohol. She reports that she does not use illicit drugs.  No Known Allergies  No family history on file.  Prior to Admission medications   Medication Sig Start Date End Date Taking? Authorizing Provider  hyoscyamine (LEVSIN, ANASPAZ) 0.125 MG tablet Take 0.125 mg by mouth  every 6 (six) hours as needed for cramping.  11/03/13  Yes Historical Provider, MD  ondansetron (ZOFRAN-ODT) 8 MG disintegrating tablet Take 8 mg by mouth every 8 (eight) hours as needed for nausea.  10/28/13  Yes Historical Provider, MD  oxyCODONE (OXY IR/ROXICODONE) 5 MG immediate release tablet Take 5 mg by mouth every 6 (six) hours as needed for moderate pain or severe pain.  10/28/13  Yes Historical Provider, MD  ranitidine (ZANTAC) 300 MG tablet Take 300 mg by mouth 2 (two) times daily as needed for heartburn.  11/06/13  Yes Historical Provider, MD  cefUROXime (CEFTIN) 500 MG tablet  10/28/13   Historical Provider, MD    Physical Exam: Filed Vitals:   11/20/13 0230 11/20/13 0306 11/20/13 0437 11/20/13 0500  BP: 100/63 100/63 89/48 102/65  Pulse: 83 88 89 94  Temp:      Resp: 18 14 16 22   SpO2: 97% 99% 98% 99%    General: Alert, Awake and Oriented to Time, Place and Person. Appear in mild distress Eyes: PERRL ENT: Oral Mucosa clear moist. Neck: no JVD Cardiovascular: S1 and S2 Present, no Murmur, Peripheral Pulses Present Respiratory: Bilateral Air entry equal and Decreased, Clear to Auscultation, noCrackles, no wheezes Abdomen: Bowel Sound present, Soft and  Sluggish, diffusely tender Skin: no Rash Extremities: no Pedal edema, no calf tenderness Neurologic: Grossly no focal neuro deficit.  Labs on Admission:  CBC:  Recent Labs Lab 11/20/13 0149  WBC 9.9  NEUTROABS 7.6  HGB 12.2  HCT 35.6*  MCV 88.6  PLT 179    CMP  Component Value Date/Time   NA 138 11/20/2013 0149   K 3.6* 11/20/2013 0149   CL 100 11/20/2013 0149   CO2 23 11/20/2013 0149   GLUCOSE 100* 11/20/2013 0149   BUN 5* 11/20/2013 0149   CREATININE 0.72 11/20/2013 0149   CALCIUM 9.3 11/20/2013 0149   PROT 8.0 11/20/2013 0149   ALBUMIN 4.1 11/20/2013 0149   AST 23 11/20/2013 0149   ALT 17 11/20/2013 0149   ALKPHOS 68 11/20/2013 0149   BILITOT 1.2 11/20/2013 0149   GFRNONAA >90 11/20/2013 0149    GFRAA >90 11/20/2013 0149    No results for input(s): LIPASE, AMYLASE in the last 168 hours. No results for input(s): AMMONIA in the last 168 hours.  No results for input(s): CKTOTAL, CKMB, CKMBINDEX, TROPONINI in the last 168 hours. BNP (last 3 results) No results for input(s): PROBNP in the last 8760 hours.  Radiological Exams on Admission: Dg Abd Acute W/chest  11/20/2013   CLINICAL DATA:  Abdominal pain.  EXAM: ACUTE ABDOMEN SERIES (ABDOMEN 2 VIEW & CHEST 1 VIEW)  COMPARISON:  CT abdomen and pelvis 08/18/2009  FINDINGS: Pulmonary hyperinflation. Normal heart size and pulmonary vascularity. No focal airspace disease or consolidation in the lungs. No blunting of costophrenic angles. No pneumothorax. Mediastinal contours appear intact.  Scattered gas and stool in the colon. No small or large bowel distention. No free intra-abdominal air. No abnormal air-fluid levels. No radiopaque stones. Visualized bones appear intact.  IMPRESSION: No evidence of active pulmonary disease. Nonobstructive bowel gas pattern.   Electronically Signed   By: Burman NievesWilliam  Stevens M.D.   On: 11/20/2013 02:55     Assessment/Plan Principal Problem:   Sepsis Active Problems:   Pyelonephritis   1. Sepsis secondary to pyelonephritis The patient is presenting with complaints of diffuse abdominal pain more on the left right flank associated with nausea vomiting. She also has soft blood pressure. She was recently admitted at [facility] for similar presentation and was treated with cephalosporin for 10 days. Currently due to recurrence of the infection on the same side with hypotension she meets sepsis criteria and I would treat her broadly with IV Zosyn until her cultures are back. IV fluids will be provided. If her symptoms does not improve she may require CT imaging. She may require outpatient urology appointment due to recurrent urosepsis  Advance goals of care discussion: full code   DVT Prophylaxis:  subcutaneous Heparin Nutrition: regular diet as tolerated  Family Communication: family was present at bedside, opportunity was given to ask question and all questions were answered satisfactorily at the time of interview. Disposition: Admitted to inpatient in step-down  unit.  Author: Lynden OxfordPranav Calleigh Lafontant, MD [facility] Pager: [phone] 11/20/2013, 6:28 AM    If 7PM-7AM, please contact night-coverage [email or web address] Password [email or web address]

## 2013-11-20 NOTE — ED Notes (Signed)
Ice water given. No needs at this time.

## 2013-11-20 NOTE — ED Notes (Signed)
Family at bedside. 

## 2013-11-20 NOTE — ED Notes (Signed)
Pt denies wanting clear liquids tray at this time.

## 2013-11-20 NOTE — ED Notes (Signed)
The pt has had pyelonephritis  Since oct 28th.  Today she started having rt sided flank pain with a headache nv   And she feels like she has had a temp.  lmp  sept29th

## 2013-11-20 NOTE — ED Notes (Signed)
Attempted to call report

## 2013-11-20 NOTE — ED Provider Notes (Signed)
CSN: [id number]     Arrival date & time 11/20/13  0032 History   First MD Initiated Contact with Patient 11/20/13 0156     Chief Complaint  Patient presents with  . Flank Pain     (Consider location/radiation/quality/duration/timing/severity/associated sxs/prior Treatment) HPI Comments: Pt comes in with cc of flank pain. Pt is healthy, but was [facility] for [facility]ph[facility] and [facility]. Records from the OSH show pansusceptible ecoli - but had difficulty controlling her pain and nausea, and thus was admitted for few days. Reports that her symptoms return today. Saw her pcp - and pt was prescribed augmentin and cipro - but her symptoms have worsened and she has increased nausea and emesis. Pain is suprapubic, and moves to the RUQ and flank region. No hx of renal stones. There is no vaginal discharge. There is no ho of STD, and no risk factors for the same.  Patient is a 28 y.o. female presenting with flank pain. The history is provided by the patient.  Flank Pain Associated symptoms include abdominal pain. Pertinent negatives include no chest pain, no headaches and no shortness of breath.    Past Medical History  Diagnosis Date  . Dorsal wrist ganglion 03/2011    right   Past Surgical History  Procedure Laterality Date  . Ganglion cyst excision  04/20/2011    Procedure: REMOVAL GANGLION OF WRIST;  Surgeon: Wyn Forsterobert V Sypher Jr., MD;  Location: [facility];  Service: Orthopedics;  Laterality: Right;  right dorsal wrist   No family history on file. History  Substance Use Topics  . Smoking status: Never Smoker   . Smokeless tobacco: Never Used  . Alcohol Use: Yes     Comment: rare   OB History    No data available     Review of Systems  Constitutional: Negative for activity change.  Respiratory: Negative for shortness of breath.   Cardiovascular: Negative for chest pain.  Gastrointestinal: Positive for nausea, vomiting and abdominal pain.   Genitourinary: Positive for dysuria, hematuria and flank pain. Negative for vaginal discharge.  Musculoskeletal: Negative for neck pain.  Neurological: Negative for headaches.      Allergies  Review of patient's allergies indicates no known allergies.  Home Medications   Prior to Admission medications   Medication Sig Start Date End Date Taking? Authorizing Provider  hyoscyamine (LEVSIN, ANASPAZ) 0.125 MG tablet Take 0.125 mg by mouth every 6 (six) hours as needed for cramping.  11/03/13  Yes Historical Provider, MD  ondansetron (ZOFRAN-ODT) 8 MG disintegrating tablet Take 8 mg by mouth every 8 (eight) hours as needed for nausea.  10/28/13  Yes Historical Provider, MD  oxyCODONE (OXY IR/ROXICODONE) 5 MG immediate release tablet Take 5 mg by mouth every 6 (six) hours as needed for moderate pain or severe pain.  10/28/13  Yes Historical Provider, MD  ranitidine (ZANTAC) 300 MG tablet Take 300 mg by mouth 2 (two) times daily as needed for heartburn.  11/06/13  Yes Historical Provider, MD  cefUROXime (CEFTIN) 500 MG tablet  10/28/13   Historical Provider, MD   BP 89/48 mmHg  Pulse 89  Temp(Src) 99.4 F (37.4 C)  Resp 16  SpO2 98%  LMP 10/14/2013 Physical Exam  Constitutional: She is oriented to person, place, and time. She appears well-developed.  HENT:  Head: Normocephalic.  Eyes: Conjunctivae are normal.  Neck: Neck supple.  Cardiovascular: Normal rate and regular rhythm.   Pulmonary/Chest: Effort normal and breath sounds normal.  Abdominal: There is tenderness. There is no rebound and no guarding.  Diffuse tenderness-  Lower quadrants, and Rt flank region, and RUQ.  Neurological: She is alert and oriented to person, place, and time.  Nursing note and vitals reviewed.   ED Course  Procedures (including critical care time) Labs Review Labs Reviewed  CBC WITH DIFFERENTIAL - Abnormal; Notable for the following:    HCT 35.6 (*)    Lymphocytes Relative 11 (*)    Monocytes  Absolute 1.2 (*)    All other components within normal limits  COMPREHENSIVE METABOLIC PANEL - Abnormal; Notable for the following:    Potassium 3.6 (*)    Glucose, Bld 100 (*)    BUN 5 (*)    All other components within normal limits  URINALYSIS, ROUTINE W REFLEX MICROSCOPIC - Abnormal; Notable for the following:    APPearance CLOUDY (*)    Hgb urine dipstick MODERATE (*)    Ketones, ur 40 (*)    Protein, ur 100 (*)    Leukocytes, UA MODERATE (*)    All other components within normal limits  URINE MICROSCOPIC-ADD ON - Abnormal; Notable for the following:    Squamous Epithelial / LPF MANY (*)    Bacteria, UA FEW (*)    All other components within normal limits  URINE CULTURE  PREGNANCY, URINE  I-STAT CG4 LACTIC ACID, ED    Imaging Review Dg Abd Acute W/chest  11/20/2013   CLINICAL DATA:  Abdominal pain.  EXAM: ACUTE ABDOMEN SERIES (ABDOMEN 2 VIEW & CHEST 1 VIEW)  COMPARISON:  CT abdomen and pelvis 08/18/2009  FINDINGS: Pulmonary hyperinflation. Normal heart size and pulmonary vascularity. No focal airspace disease or consolidation in the lungs. No blunting of costophrenic angles. No pneumothorax. Mediastinal contours appear intact.  Scattered gas and stool in the colon. No small or large bowel distention. No free intra-abdominal air. No abnormal air-fluid levels. No radiopaque stones. Visualized bones appear intact.  IMPRESSION: No evidence of active pulmonary disease. Nonobstructive bowel gas pattern.   Electronically Signed   By: Burman NievesWilliam  Stevens M.D.   On: 11/20/2013 02:55     EKG Interpretation None      MDM   Final diagnoses:  Abdominal pain  Pyelonephritis  Nausea and vomiting in adult  Dehydration  Ketonuria    Pt comes in with cc of abd pain. Has dysuria, and hematuria and recent admission for pyelonephritis, and her current sx are similar to that admission. Has diffuse tenderness, given the pt explicitly stating that her sx were the same last time, and records  from outside hospital showing pyelonephritis, and the UA today showing bacteria and ++++WBC, we havent done a pelvic exam or any imaging outside of [facility] - which shows no free air, or incidental stones. Pt will be admitted.   Derwood KaplanAnkit Roise Emert, MD 11/20/13 [phone]

## 2013-11-21 ENCOUNTER — Inpatient Hospital Stay (HOSPITAL_COMMUNITY): Payer: BC Managed Care – PPO

## 2013-11-21 ENCOUNTER — Encounter (HOSPITAL_COMMUNITY): Payer: Self-pay | Admitting: Radiology

## 2013-11-21 DIAGNOSIS — R109 Unspecified abdominal pain: Secondary | ICD-10-CM

## 2013-11-21 DIAGNOSIS — R1084 Generalized abdominal pain: Secondary | ICD-10-CM

## 2013-11-21 LAB — URINE CULTURE: Colony Count: 9000

## 2013-11-21 LAB — LIPASE, BLOOD: Lipase: 15 U/L (ref 11–59)

## 2013-11-21 MED ORDER — FENTANYL CITRATE 0.05 MG/ML IJ SOLN
25.0000 ug | INTRAMUSCULAR | Status: DC | PRN
  Administered 2013-11-21 (×3): 25 ug via INTRAVENOUS
  Filled 2013-11-21 (×3): qty 2

## 2013-11-21 MED ORDER — SODIUM CHLORIDE 0.9 % IV SOLN: INTRAVENOUS | Status: DC

## 2013-11-21 MED ORDER — IOHEXOL 300 MG/ML  SOLN
80.0000 mL | Freq: Once | INTRAMUSCULAR | Status: AC | PRN
  Administered 2013-11-21: 80 mL via INTRAVENOUS

## 2013-11-21 MED ORDER — FENTANYL CITRATE 0.05 MG/ML IJ SOLN
50.0000 ug | INTRAMUSCULAR | Status: DC | PRN
  Administered 2013-11-21 – 2013-11-25 (×12): 50 ug via INTRAVENOUS
  Filled 2013-11-21 (×12): qty 2

## 2013-11-21 MED ORDER — PANTOPRAZOLE SODIUM 40 MG IV SOLR
40.0000 mg | Freq: Two times a day (BID) | INTRAVENOUS | Status: DC
  Administered 2013-11-21 – 2013-11-23 (×5): 40 mg via INTRAVENOUS
  Filled 2013-11-21 (×7): qty 40

## 2013-11-21 MED ORDER — BISACODYL 10 MG RE SUPP: 10.0000 mg | Freq: Once | RECTAL | Status: DC

## 2013-11-21 NOTE — Consult Note (Signed)
[facility] Consult Note  Referring Provider: No ref. provider found Primary Care Physician:  Vena Austria, MD Primary Gastroenterologist:  Dr.  Laurel Dimmer Complaint: abdominal pain nausea and vomiting HPI: Danielle [name] is an 28 y.o. black  female  Admitted with right flank and generalized abdominal pain with persistent nausea and vomiting. The onset of her symptoms dates back 3 or 4 weeks ago when she was admitted at [facility] for pyelonephritis. Apparently this was a fairly solid diagnosis and her symptoms of flank pain and vomiting responded to antibiotics. She was released after 5 days and did well for 3 or 4 days when she started feeling malaise recurrent flank pain and recurrent nausea and some vomiting. She saw her primary care physician who did a urinalysis which apparently showed some evidence of recurrent infection and she was started back on Augmentin but her symptoms worsened and she is now complaining of vomiting after any by mouth intake as well as generalized abdominal pain as well as right upper quadrant pain and chest pain. She had an abdominal she denies any dysphagia.ultrasound before admission which showed no gallbladder issues and an abdominal pelvic CT scan during this hospitalization which was negative. All routine lab work and then negative at this point. She denies any diarrhea. She denies any dysphagia. Past Medical History  Diagnosis Date  . Dorsal wrist ganglion 03/2011    right  . Acute renal failure   . Pyelonephritis 10/2013    right, treated at [facility]. urine grew pan sensitive e coli.   . Protein calorie malnutrition 10/2013  . Pleural effusion, bilateral 10/30/13    and compressive atx on CT chest at [facility].   . Thrombocytopenia 10/2013    low of 104 10/24/2013    Past Surgical History  Procedure Laterality Date  . Ganglion cyst excision  04/20/2011    Procedure: REMOVAL GANGLION OF WRIST;  Surgeon: Cammie Sickle., MD;  Location:  [place];  Service: Orthopedics;  Laterality: Right;  right dorsal wrist    Medications Prior to Admission  Medication Sig Dispense Refill  . acetaminophen (TYLENOL) 325 MG tablet Take 325-650 mg by mouth every 6 (six) hours as needed for moderate pain or fever.    Marland Kitchen amoxicillin-clavulanate (AUGMENTIN) 875-125 MG per tablet Take 1 tablet by mouth 2 (two) times daily. Treating UTI and Flank Pain    . ciprofloxacin (CIPRO) 500 MG tablet Take 500 mg by mouth every 12 (twelve) hours. Treating UTI and Flank Pain    . docusate sodium (COLACE) 100 MG capsule Take 100 mg by mouth daily as needed for mild constipation.    . fluconazole (DIFLUCAN) 150 MG tablet Take 150 mg by mouth once. To prevent yeast infection while on antibiotics    . hyoscyamine (LEVSIN, ANASPAZ) 0.125 MG tablet Take 0.125-0.25 mg by mouth every 6 (six) hours as needed for cramping.   0  . ondansetron (ZOFRAN-ODT) 8 MG disintegrating tablet Take 8 mg by mouth every 8 (eight) hours as needed for nausea.   0  . oxyCODONE (OXY IR/ROXICODONE) 5 MG immediate release tablet Take 5 mg by mouth every 6 (six) hours as needed for moderate pain or severe pain.   0  . ranitidine (ZANTAC) 300 MG tablet Take 300 mg by mouth 2 (two) times daily as needed for heartburn.   0    Allergies: No Known Allergies  No family history on file.  Social History:  reports that she has never smoked. She has  never used smokeless tobacco. She reports that she drinks alcohol. She reports that she does not use illicit drugs.  Review of Systems: negative except mild constipation   Blood pressure 102/86, pulse 80, temperature 98 F (36.7 C), temperature source Oral, resp. rate 17, height 6' (1.829 m), weight 58.151 kg (128 lb 3.2 oz), last menstrual period 10/14/2013, SpO2 100 %. Head: Normocephalic, without obvious abnormality, atraumatic Neck: no adenopathy, no carotid bruit, no JVD, supple, symmetrical, trachea midline and thyroid not  enlarged, symmetric, no tenderness/mass/nodules Resp: clear to auscultation bilaterally Cardio: regular rate and rhythm, S1, S2 normal, no murmur, click, rub or gallop GI: abdomen slightly distendedsemitaut, there is fairly widespread tenderness to light palpation with voluntary guarding throughout most of the abdomen. No masses are felt. Extremities: extremities normal, atraumatic, no cyanosis or edema  Results for orders placed or performed during the hospital encounter of 11/20/13 (from the past 48 hour(s))  CBC with Differential     Status: Abnormal   Collection Time: 11/20/13  1:49 AM  Result Value Ref Range   WBC 9.9 4.0 - 10.5 K/uL   RBC 4.02 3.87 - 5.11 MIL/uL   Hemoglobin 12.2 12.0 - 15.0 g/dL   HCT 35.6 (L) 36.0 - 46.0 %   MCV 88.6 78.0 - 100.0 fL   MCH 30.3 26.0 - 34.0 pg   MCHC 34.3 30.0 - 36.0 g/dL   RDW 12.5 11.5 - 15.5 %   Platelets 179 150 - 400 K/uL   Neutrophils Relative % 77 43 - 77 %   Neutro Abs 7.6 1.7 - 7.7 K/uL   Lymphocytes Relative 11 (L) 12 - 46 %   Lymphs Abs 1.1 0.7 - 4.0 K/uL   Monocytes Relative 12 3 - 12 %   Monocytes Absolute 1.2 (H) 0.1 - 1.0 K/uL   Eosinophils Relative 0 0 - 5 %   Eosinophils Absolute 0.0 0.0 - 0.7 K/uL   Basophils Relative 0 0 - 1 %   Basophils Absolute 0.0 0.0 - 0.1 K/uL  Comprehensive metabolic panel     Status: Abnormal   Collection Time: 11/20/13  1:49 AM  Result Value Ref Range   Sodium 138 137 - 147 mEq/L   Potassium 3.6 (L) 3.7 - 5.3 mEq/L   Chloride 100 96 - 112 mEq/L   CO2 23 19 - 32 mEq/L   Glucose, Bld 100 (H) 70 - 99 mg/dL   BUN 5 (L) 6 - 23 mg/dL   Creatinine, Ser 0.72 0.50 - 1.10 mg/dL   Calcium 9.3 8.4 - 10.5 mg/dL   Total Protein 8.0 6.0 - 8.3 g/dL   Albumin 4.1 3.5 - 5.2 g/dL   AST 23 0 - 37 U/L   ALT 17 0 - 35 U/L   Alkaline Phosphatase 68 39 - 117 U/L   Total Bilirubin 1.2 0.3 - 1.2 mg/dL   GFR calc non Af Amer >90 >90 mL/min   GFR calc Af Amer >90 >90 mL/min    Comment: (NOTE) The eGFR has been  calculated using the CKD EPI equation. This calculation has not been validated in all clinical situations. eGFR's persistently <90 mL/min signify possible Chronic Kidney Disease.    Anion gap 15 5 - 15  Urinalysis, Routine w reflex microscopic     Status: Abnormal   Collection Time: 11/20/13  2:06 AM  Result Value Ref Range   Color, Urine YELLOW YELLOW   APPearance CLOUDY (A) CLEAR   Specific Gravity, Urine 1.015 1.005 - 1.030  pH 7.5 5.0 - 8.0   Glucose, UA NEGATIVE NEGATIVE mg/dL   Hgb urine dipstick MODERATE (A) NEGATIVE   Bilirubin Urine NEGATIVE NEGATIVE   Ketones, ur 40 (A) NEGATIVE mg/dL   Protein, ur 100 (A) NEGATIVE mg/dL   Urobilinogen, UA 0.2 0.0 - 1.0 mg/dL   Nitrite NEGATIVE NEGATIVE   Leukocytes, UA MODERATE (A) NEGATIVE  Pregnancy, urine     Status: None   Collection Time: 11/20/13  2:06 AM  Result Value Ref Range   Preg Test, Ur NEGATIVE NEGATIVE    Comment:        THE SENSITIVITY OF THIS METHODOLOGY IS >20 mIU/mL.   Urine culture     Status: None   Collection Time: 11/20/13  2:06 AM  Result Value Ref Range   Specimen Description URINE, CLEAN CATCH    Special Requests NONE    Culture  Setup Time      11/20/2013 11:02 Performed at [place]      9,000 COLONIES/ML Performed at [facility]    Culture      INSIGNIFICANT GROWTH Performed at [facility]    Report Status 11/21/2013 FINAL   Urine microscopic-add on     Status: Abnormal   Collection Time: 11/20/13  2:06 AM  Result Value Ref Range   Squamous Epithelial / LPF MANY (A) RARE   WBC, UA TOO NUMEROUS TO COUNT <3 WBC/hpf   RBC / HPF 7-10 <3 RBC/hpf   Bacteria, UA FEW (A) RARE  Comprehensive metabolic panel     Status: Abnormal   Collection Time: 11/20/13  5:53 AM  Result Value Ref Range   Sodium 140 137 - 147 mEq/L   Potassium 4.1 3.7 - 5.3 mEq/L   Chloride 104 96 - 112 mEq/L   CO2 21 19 - 32 mEq/L   Glucose, Bld 91 70 - 99 mg/dL   BUN 5 (L) 6  - 23 mg/dL   Creatinine, Ser 0.72 0.50 - 1.10 mg/dL   Calcium 8.3 (L) 8.4 - 10.5 mg/dL   Total Protein 7.1 6.0 - 8.3 g/dL   Albumin 3.5 3.5 - 5.2 g/dL   AST 22 0 - 37 U/L   ALT 16 0 - 35 U/L   Alkaline Phosphatase 59 39 - 117 U/L   Total Bilirubin 0.7 0.3 - 1.2 mg/dL   GFR calc non Af Amer >90 >90 mL/min   GFR calc Af Amer >90 >90 mL/min    Comment: (NOTE) The eGFR has been calculated using the CKD EPI equation. This calculation has not been validated in all clinical situations. eGFR's persistently <90 mL/min signify possible Chronic Kidney Disease.    Anion gap 15 5 - 15  CBC     Status: Abnormal   Collection Time: 11/20/13  5:53 AM  Result Value Ref Range   WBC 7.7 4.0 - 10.5 K/uL   RBC 3.88 3.87 - 5.11 MIL/uL   Hemoglobin 11.5 (L) 12.0 - 15.0 g/dL   HCT 35.3 (L) 36.0 - 46.0 %   MCV 91.0 78.0 - 100.0 fL   MCH 29.6 26.0 - 34.0 pg   MCHC 32.6 30.0 - 36.0 g/dL   RDW 12.6 11.5 - 15.5 %   Platelets 156 150 - 400 K/uL  Protime-INR     Status: None   Collection Time: 11/20/13  5:53 AM  Result Value Ref Range   Prothrombin Time 14.6 11.6 - 15.2 seconds   INR 1.13 0.00 -  1.49  Lactic acid, plasma     Status: None   Collection Time: 11/20/13  5:53 AM  Result Value Ref Range   Lactic Acid, Venous 1.4 0.5 - 2.2 mmol/L  Culture, blood (routine x 2)     Status: None (Preliminary result)   Collection Time: 11/20/13  6:08 AM  Result Value Ref Range   Specimen Description BLOOD RIGHT HAND    Special Requests BOTTLES DRAWN AEROBIC ONLY 5CC    Culture  Setup Time      11/20/2013 11:00 Performed at [place] NO GROWTH TO DATE CULTURE WILL BE HELD FOR 5 DAYS BEFORE ISSUING A FINAL NEGATIVE REPORT Performed at [facility]    Report Status PENDING   Culture, blood (routine x 2)     Status: None (Preliminary result)   Collection Time: 11/20/13  6:18 AM  Result Value Ref Range   Specimen Description BLOOD LEFT ARM     Special Requests BOTTLES DRAWN AEROBIC AND ANAEROBIC 10CC EACH    Culture  Setup Time      11/20/2013 11:00 Performed at [place] NO GROWTH TO DATE CULTURE WILL BE HELD FOR 5 DAYS BEFORE ISSUING A FINAL NEGATIVE REPORT Performed at [facility]    Report Status PENDING    Ct Abdomen Pelvis W Contrast  11/21/2013   CLINICAL DATA:  Right-sided abdominal pain for 3 days  EXAM: CT ABDOMEN AND PELVIS WITH CONTRAST  TECHNIQUE: Multidetector CT imaging of the abdomen and pelvis was performed using the standard protocol following bolus administration of intravenous contrast.  CONTRAST:  13m OMNIPAQUE IOHEXOL 300 MG/ML  SOLN  COMPARISON:  08/18/2009, 11/04/2013  FINDINGS: Lung bases are well aerated with very minimal left basilar atelectasis posteriorly.  The liver shows evidence of a few scattered hypodensities likely representing cysts but incompletely evaluated on this exam. They are however stable from the prior study in 2011. The gallbladder, spleen, adrenal glands and pancreas are all normal in their CT appearance. The kidneys demonstrate a normal enhancement pattern. No renal calculi or obstructive changes are seen.  The appendix is well filled with barium. The bladder is well distended. No pelvic mass lesion is seen ovarian cystic changes are noted on the left. Minimal free fluid is noted within the pelvis likely physiologic in nature. No bony abnormality is noted.  IMPRESSION: Left ovarian cystic change.  Stable hypodensities within the liver likely representing cysts.  No other focal abnormality is seen.   Electronically Signed   By: MInez CatalinaM.D.   On: 11/21/2013 14:20   Dg Abd Acute W/chest  11/20/2013   CLINICAL DATA:  Abdominal pain.  EXAM: ACUTE ABDOMEN SERIES (ABDOMEN 2 VIEW & CHEST 1 VIEW)  COMPARISON:  CT abdomen and pelvis 08/18/2009  FINDINGS: Pulmonary hyperinflation. Normal heart size and pulmonary vascularity. No  focal airspace disease or consolidation in the lungs. No blunting of costophrenic angles. No pneumothorax. Mediastinal contours appear intact.  Scattered gas and stool in the colon. No small or large bowel distention. No free intra-abdominal air. No abnormal air-fluid levels. No radiopaque stones. Visualized bones appear intact.  IMPRESSION: No evidence of active pulmonary disease. Nonobstructive bowel gas pattern.   Electronically Signed   By: WLucienne CapersM.D.   On: 11/20/2013  02:55    Assessment: Recurrent nausea vomiting and diffuse abdominal pain after a course of pyelonephritis which apparentlynot felt to explain her symptomatology even though she had too numerous to count WBCs on her urinalysis.urine cultures and blood cultures negative. CT of abdomen and pelvis unremarkable. Plan:   Will check serum lipase level.  Given multiple negative studies and an failure to respond to high-dose antibiotics will proceed with EGD although I expect this will be low yield.  Amen Dargis C 11/21/2013, 2:52 PM

## 2013-11-21 NOTE — Plan of Care (Signed)
Problem: Phase I Progression Outcomes Goal: Pain controlled with appropriate interventions Outcome: Progressing Fentanyl IV dose increased 89mg to 586m, pt now voices that pain medication is effective    Goal: OOB as tolerated unless otherwise ordered Outcome: Progressing Goal: Initial discharge plan identified Outcome: Progressing Goal: Voiding-avoid urinary catheter unless indicated Outcome: Progressing  Problem: Phase II Progression Outcomes Goal: Progress activity as tolerated unless otherwise ordered Outcome: Progressing Goal: Discharge plan established Outcome: Progressing Goal: Vital signs remain stable Outcome: Progressing Goal: Obtain order to discontinue catheter if appropriate Outcome: Not Applicable Date Met:  1184/85/92

## 2013-11-21 NOTE — Progress Notes (Signed)
[facility] Progress Note   Danielle FieldMegan [name] RUE:[id number] DOB: 1985/03/03 DOA: 11/20/2013 PCP: Lolita PatellaEADE,ROBERT ALEXANDER, MD  Brief narrative: Danielle FieldMegan [name] is a 28 y.o. female who presented with vomiting and abdominal and flank pain. She was [facility] and had completed her antibiotics before these symptoms occured.    Subjective: Continues to have severe right flank and throughout abdomen and regurgitation of food. Unable to tolerate liquids.   Assessment/Plan: Active Problems:   Abdominal pain/ regurgitation - initially suspected this was recurrent Pyelonephritis but Urine culture not very suggestive for infection - will obtain CT abd/pelvis as symptoms have not improved despite 24 hrs of antibiotics - cont on clear liquids for now - will request a GI eval   Code Status: Full code Family Communication: with mother Disposition Plan: home once stable DVT prophylaxis: Lovenox  Consultants: GI  Procedures: none  Antibiotics: Anti-infectives    Start     Dose/Rate Route Frequency Ordered Stop   11/20/13 0600  piperacillin-tazobactam (ZOSYN) IVPB 3.375 g     3.375 g12.5 mL/hr over 240 Minutes Intravenous 3 times per day 11/20/13 0518     11/20/13 0400  cefTRIAXone (ROCEPHIN) 1 g in dextrose 5 % 50 mL IVPB     1 g100 mL/hr over 30 Minutes Intravenous  Once 11/20/13 0346 11/20/13 0432         Objective: Filed Weights   11/20/13 1545 11/21/13 0529  Weight: 58.06 kg (128 lb) 58.151 kg (128 lb 3.2 oz)    Intake/Output Summary (Last 24 hours) at 11/21/13 1329 Last data filed at 11/21/13 1008  Gross per 24 hour  Intake   1980 ml  Output      0 ml  Net   1980 ml     Vitals Filed Vitals:   11/20/13 1400 11/20/13 1545 11/20/13 2209 11/21/13 0529  BP: 102/61  114/64 106/62  Pulse: 79  79 77  Temp:   99.6 F (37.6 C) 98.3 F (36.8 C)  TempSrc:   Oral Oral  Resp: 20  18 17   Height:  6' (1.829 m)     Weight:  58.06 kg (128 lb)  58.151 kg (128 lb 3.2 oz)  SpO2: 99%  100% 100%    Exam: General: No acute respiratory distress Lungs: Clear to auscultation bilaterally without wheezes or crackles Cardiovascular: Regular rate and rhythm without murmur gallop or rub normal S1 and S2 Abdomen: significantly tender through- out,  Mildly distended, soft, bowel sounds positive, no rebound, no ascites, no appreciable mass Extremities: No significant cyanosis, clubbing, or edema bilateral lower extremities  Data Reviewed: Basic Metabolic Panel:  Recent Labs Lab 11/20/13 0149 11/20/13 0553  NA 138 140  K 3.6* 4.1  CL 100 104  CO2 23 21  GLUCOSE 100* 91  BUN 5* 5*  CREATININE 0.72 0.72  CALCIUM 9.3 8.3*   Liver Function Tests:  Recent Labs Lab 11/20/13 0149 11/20/13 0553  AST 23 22  ALT 17 16  ALKPHOS 68 59  BILITOT 1.2 0.7  PROT 8.0 7.1  ALBUMIN 4.1 3.5   No results for input(s): LIPASE, AMYLASE in the last 168 hours. No results for input(s): AMMONIA in the last 168 hours. CBC:  Recent Labs Lab 11/20/13 0149 11/20/13 0553  WBC 9.9 7.7  NEUTROABS 7.6  --   HGB 12.2 11.5*  HCT 35.6* 35.3*  MCV 88.6 91.0  PLT 179 156   Cardiac Enzymes: No results for input(s): CKTOTAL, CKMB, CKMBINDEX, TROPONINI in  the last 168 hours. BNP (last 3 results) No results for input(s): PROBNP in the last 8760 hours. CBG: No results for input(s): GLUCAP in the last 168 hours.  Recent Results (from the past 240 hour(s))  Urine culture     Status: None   Collection Time: 11/20/13  2:06 AM  Result Value Ref Range Status   Specimen Description URINE, CLEAN CATCH  Final   Special Requests NONE  Final   Culture  Setup Time   Final    11/20/2013 11:02 Performed at [facility]    Colony Count   Final    9,000 COLONIES/ML [facility]formed at [facility]    Culture   Final    INSIGNIFICANT GROWTH Performed at [facility]    Report Status 11/21/2013 FINAL  Final   Culture, blood (routine x 2)     Status: None (Preliminary result)   Collection Time: 11/20/13  6:08 AM  Result Value Ref Range Status   Specimen Description BLOOD RIGHT HAND  Final   Special Requests BOTTLES DRAWN AEROBIC ONLY 5CC  Final   Culture  Setup Time   Final    11/20/2013 11:00 Performed at [facility]    Culture   Final           BLOOD CULTURE RECEIVED NO GROWTH TO DATE CULTURE WILL BE HELD FOR 5 DAYS BEFORE ISSUING A FINAL NEGATIVE REPORT Performed at [facility]    Report Status PENDING  Incomplete  Culture, blood (routine x 2)     Status: None (Preliminary result)   Collection Time: 11/20/13  6:18 AM  Result Value Ref Range Status   Specimen Description BLOOD LEFT ARM  Final   Special Requests BOTTLES DRAWN AEROBIC AND ANAEROBIC 10CC EACH  Final   Culture  Setup Time   Final    11/20/2013 11:00 Performed at [facility]    Culture   Final           BLOOD CULTURE RECEIVED NO GROWTH TO DATE CULTURE WILL BE HELD FOR 5 DAYS BEFORE ISSUING A FINAL NEGATIVE REPORT Performed at [facility]    Report Status PENDING  Incomplete     Studies:  Recent x-ray studies have been reviewed in detail by the Attending Physician  Scheduled Meds:  Scheduled Meds: . enoxaparin (LOVENOX) injection  40 mg Subcutaneous Q24H  . famotidine (PEPCID) IV  20 mg Intravenous Q12H  . pantoprazole (PROTONIX) IV  40 mg Intravenous Q12H  . piperacillin-tazobactam (ZOSYN)  IV  3.375 g Intravenous 3 times per day  . sodium chloride  3 mL Intravenous Q12H   Continuous Infusions: . sodium chloride 125 mL/hr at 11/21/13 1030    Time spent on care of this patient: 35 min   Kaelon Weekes, MD 11/21/2013, 1:29 PM  LOS: 1 day   [facility] Office  [phone] Pager - Text Page per [email or web address]  If 7PM-7AM, please contact night-coverage [email or web address]

## 2013-11-22 ENCOUNTER — Encounter (HOSPITAL_COMMUNITY): Payer: Self-pay | Admitting: *Deleted

## 2013-11-22 ENCOUNTER — Encounter (HOSPITAL_COMMUNITY)
Admission: EM | Disposition: A | Discharge status: Home / Self Care | Payer: Self-pay | Source: Home / Self Care | Attending: Internal Medicine

## 2013-11-22 DIAGNOSIS — R1013 Epigastric pain: Secondary | ICD-10-CM

## 2013-11-22 HISTORY — PX: ESOPHAGOGASTRODUODENOSCOPY: SHX5428

## 2013-11-22 SURGERY — EGD (ESOPHAGOGASTRODUODENOSCOPY)
Anesthesia: Moderate Sedation

## 2013-11-22 MED ORDER — CIPROFLOXACIN HCL 500 MG PO TABS: 500.0000 mg | ORAL_TABLET | Freq: Two times a day (BID) | ORAL | Status: DC

## 2013-11-22 MED ORDER — DICLOFENAC SODIUM 1 % TD GEL
4.0000 g | Freq: Four times a day (QID) | TRANSDERMAL | Status: DC
  Administered 2013-11-22 – 2013-11-26 (×9): 4 g via TOPICAL
  Filled 2013-11-22: qty 100

## 2013-11-22 MED ORDER — BOOST / RESOURCE BREEZE PO LIQD
1.0000 | Freq: Three times a day (TID) | ORAL | Status: DC
  Administered 2013-11-22 – 2013-11-24 (×4): 1 via ORAL

## 2013-11-22 MED ORDER — MIDAZOLAM HCL 10 MG/2ML IJ SOLN
INTRAMUSCULAR | Status: DC | PRN
  Administered 2013-11-22: 2 mg via INTRAVENOUS
  Administered 2013-11-22: 4 mg via INTRAVENOUS

## 2013-11-22 MED ORDER — FENTANYL CITRATE 0.05 MG/ML IJ SOLN
INTRAMUSCULAR | Status: AC
  Filled 2013-11-22: qty 2

## 2013-11-22 MED ORDER — ACETAMINOPHEN 325 MG PO TABS
325.0000 mg | ORAL_TABLET | Freq: Four times a day (QID) | ORAL | Status: DC | PRN
Start: 2013-11-22 — End: 2013-11-22

## 2013-11-22 MED ORDER — FLUCONAZOLE 150 MG PO TABS: 150.0000 mg | ORAL_TABLET | Freq: Once | ORAL | Status: DC

## 2013-11-22 MED ORDER — MIDAZOLAM HCL 5 MG/ML IJ SOLN
INTRAMUSCULAR | Status: AC
  Filled 2013-11-22: qty 2

## 2013-11-22 MED ORDER — CIPROFLOXACIN IN D5W 400 MG/200ML IV SOLN
400.0000 mg | Freq: Two times a day (BID) | INTRAVENOUS | Status: DC
  Administered 2013-11-22 – 2013-11-24 (×4): 400 mg via INTRAVENOUS
  Filled 2013-11-22 (×5): qty 200

## 2013-11-22 MED ORDER — BUTAMBEN-TETRACAINE-BENZOCAINE 2-2-14 % EX AERO
INHALATION_SPRAY | CUTANEOUS | Status: DC | PRN
  Administered 2013-11-22: 2 via TOPICAL

## 2013-11-22 MED ORDER — DOCUSATE SODIUM 100 MG PO CAPS: 100.0000 mg | ORAL_CAPSULE | Freq: Every day | ORAL | Status: DC | PRN

## 2013-11-22 MED ORDER — AMOXICILLIN-POT CLAVULANATE 875-125 MG PO TABS: 1.0000 | ORAL_TABLET | Freq: Two times a day (BID) | ORAL | Status: DC

## 2013-11-22 MED ORDER — FENTANYL CITRATE 0.05 MG/ML IJ SOLN
INTRAMUSCULAR | Status: DC | PRN
  Administered 2013-11-22: 25 ug via INTRAVENOUS
  Administered 2013-11-22: 50 ug via INTRAVENOUS

## 2013-11-22 NOTE — Progress Notes (Signed)
INITIAL NUTRITION ASSESSMENT  DOCUMENTATION CODES Per approved criteria  -Underweight   INTERVENTION: Resource Breeze po TID, each supplement provides 250 kcal and 9 grams of protein  NUTRITION DIAGNOSIS: Inadequate oral intake related to abdominal pain and nausea/vomiting as evidenced by reported wt loss.   Goal: Pt to meet >/= 90% of their estimated nutrition needs   Monitor:  Weight trend, po intake, acceptance of supplements, diet advancement  Reason for Assessment: MST  28 y.o. female  Admitting Dx: Sepsis  ASSESSMENT: 28 y.o. black female Admitted with right flank and generalized abdominal pain with persistent nausea and vomiting.   - Pt was lethargic and falling asleep during RD visit. Would not open eyes to talk.  - Pt said that she has not been eating well and has had a 4-5 lb wt loss since the onset of her illness. She reports feeling hungry currently.  - Nutrition focused physical exam not completed. - Pt will be sent nutritional supplements to improve po intake.   Height: Ht Readings from Last 1 Encounters:  11/20/13 6' (1.829 m)    Weight: Wt Readings from Last 1 Encounters:  11/22/13 136 lb 14.4 oz (62.097 kg)    Ideal Body Weight: 73.1 kg  % Ideal Body Weight: 85%  Wt Readings from Last 10 Encounters:  11/22/13 136 lb 14.4 oz (62.097 kg)  04/13/11 135 lb (61.236 kg)    Usual Body Weight: 135-140 lbs  % Usual Body Weight: 97%  BMI:  Body mass index is 18.56 kg/(m^2).  Estimated Nutritional Needs: Kcal: 1700-1900 Protein: 85-100 g Fluid: 1.9 L/day  Skin: intact  Diet Order: Diet full liquid  EDUCATION NEEDS: -Education needs addressed   Intake/Output Summary (Last 24 hours) at 11/22/13 1218 Last data filed at 11/22/13 0546  Gross per 24 hour  Intake 3532.08 ml  Output      0 ml  Net 3532.08 ml    Last BM: prior to admission   Labs:   Recent Labs Lab 11/20/13 0149 11/20/13 0553  NA 138 140  K 3.6* 4.1  CL 100 104   CO2 23 21  BUN 5* 5*  CREATININE 0.72 0.72  CALCIUM 9.3 8.3*  GLUCOSE 100* 91    CBG (last 3)  No results for input(s): GLUCAP in the last 72 hours.  Scheduled Meds: . enoxaparin (LOVENOX) injection  40 mg Subcutaneous Q24H  . famotidine (PEPCID) IV  20 mg Intravenous Q12H  . pantoprazole (PROTONIX) IV  40 mg Intravenous Q12H  . piperacillin-tazobactam (ZOSYN)  IV  3.375 g Intravenous 3 times per day  . sodium chloride  3 mL Intravenous Q12H    Continuous Infusions: . sodium chloride 125 mL/hr at 11/22/13 1119    Past Medical History  Diagnosis Date  . Dorsal wrist ganglion 03/2011    right  . Acute renal failure   . Pyelonephritis 10/2013    right, treated at [facility]. urine grew pan sensitive e coli.   . Protein calorie malnutrition 10/2013  . Pleural effusion, bilateral 10/30/13    and [facility]   . Thrombocytopenia 10/2013    low of 104 10/24/2013    Past Surgical History  Procedure Laterality Date  . Ganglion cyst excision  04/20/2011    Procedure: REMOVAL GANGLION OF WRIST;  Surgeon: Wyn Forsterobert V Sypher Jr., MD;  Location: [facility];  Service: Orthopedics;  Laterality: Right;  right dorsal wrist    Emmaline KluverHaley Reynalda Canny RD, LDN

## 2013-11-22 NOTE — Op Note (Signed)
[facility] [address] [place], [id number]   ENDOSCOPY PROCEDURE REPORT  PATIENT: Danielle [name], Danielle [name]  MR#: [id number] BIRTHDATE: 01/15/1986 , 28  yrs. old GENDER: female ENDOSCOPIST: Carman ChingJames Charline Hoskinson, MD REFERRED BY:  Elias Elseobert Reade, M.D. PROCEDURE DATE:  11/22/2013 PROCEDURE:  EGD w/ biopsy ASA CLASS:     Class I INDICATIONS:  right upper quadrant abdominal pain. MEDICATIONS: Fentanyl 75 mcg IV and Versed 6 mg IV TOPICAL ANESTHETIC: Cetacaine Spray  DESCRIPTION OF PROCEDURE: After the risks benefits and alternatives of the procedure were thoroughly explained, informed consent was obtained.  The Pentax Gastroscope [id number] endoscope was introduced through the mouth and advanced to the second portion of the duodenum , Without limitations.  The instrument was slowly withdrawn as the mucosa was fully examined.      Red mucosa in the distal esophagus above the GE junction.  STOMACH: The mucosa of the stomach appeared normal.  DUODENUM: The duodenal mucosa showed no abnormalities.  Cold forcep biopsies were taken in the second portion.  Retroflexed views revealed as previously described.     The scope was then withdrawn from the patient and the procedure completed.  COMPLICATIONS: There were no immediate complications.  ENDOSCOPIC IMPRESSION: 1.   Red mucosa in the distal esophagus above the GE junction . This probably represents esophageal reflux 2.   The mucosa of the stomach appeared normal 3.   The duodenal mucosa showed no abnormalities cold forcep biopsies were taken in the second portion.biopsies will be sent to evaluate for celiac disease  RECOMMENDATIONS: Would follow patient clinically  REPEAT EXAM:  eSigned:  Carman ChingJames Karman Veney, MD 11/22/2013 10:29 AM    XB:JYNWGNCC:Robert Nicholos Johnseade, MD  CPT CODES: ICD CODES:  The ICD and CPT codes recommended by this software are interpretations from the data that the clinical staff has captured with the  software.  The verification of the translation of this report to the ICD and CPT codes and modifiers is the sole responsibility of the health care institution and practicing physician where this report was generated.  [employer] will not be held responsible for the validity of the ICD and CPT codes included on this report.  AMA assumes no liability for data contained or not contained herein. CPT is a Publishing rights managerregistered trademark of the [employer].  PATIENT NAME:  Danielle [name], Danielle [name] MR#: [id number]

## 2013-11-22 NOTE — H&P (View-Only) (Signed)
[facility] Consult Note  Referring Provider: No ref. provider found Primary Care Physician:  Vena Austria, MD Primary Gastroenterologist:  Dr.  Laurel Dimmer Complaint: abdominal pain nausea and vomiting HPI: Danielle [name] is an 28 y.o. black  female  Admitted with right flank and generalized abdominal pain with persistent nausea and vomiting. The onset of her symptoms dates back 3 or 4 weeks ago when she was admitted at [facility] for pyelonephritis. Apparently this was a fairly solid diagnosis and her symptoms of flank pain and vomiting responded to antibiotics. She was released after 5 days and did well for 3 or 4 days when she started feeling malaise recurrent flank pain and recurrent nausea and some vomiting. She saw her primary care physician who did a urinalysis which apparently showed some evidence of recurrent infection and she was started back on Augmentin but her symptoms worsened and she is now complaining of vomiting after any by mouth intake as well as generalized abdominal pain as well as right upper quadrant pain and chest pain. She had an abdominal she denies any dysphagia.ultrasound before admission which showed no gallbladder issues and an abdominal pelvic CT scan during this hospitalization which was negative. All routine lab work and then negative at this point. She denies any diarrhea. She denies any dysphagia. Past Medical History  Diagnosis Date  . Dorsal wrist ganglion 03/2011    right  . Acute renal failure   . Pyelonephritis 10/2013    right, treated at [facility]. urine grew pan sensitive e coli.   . Protein calorie malnutrition 10/2013  . Pleural effusion, bilateral 10/30/13    and compressive atx on CT chest at [facility].   . Thrombocytopenia 10/2013    low of 104 10/24/2013    Past Surgical History  Procedure Laterality Date  . Ganglion cyst excision  04/20/2011    Procedure: REMOVAL GANGLION OF WRIST;  Surgeon: Cammie Sickle., MD;  Location:  [facility];  Service: Orthopedics;  Laterality: Right;  right dorsal wrist    Medications Prior to Admission  Medication Sig Dispense Refill  . acetaminophen (TYLENOL) 325 MG tablet Take 325-650 mg by mouth every 6 (six) hours as needed for moderate pain or fever.    Marland Kitchen amoxicillin-clavulanate (AUGMENTIN) 875-125 MG per tablet Take 1 tablet by mouth 2 (two) times daily. Treating UTI and Flank Pain    . ciprofloxacin (CIPRO) 500 MG tablet Take 500 mg by mouth every 12 (twelve) hours. Treating UTI and Flank Pain    . docusate sodium (COLACE) 100 MG capsule Take 100 mg by mouth daily as needed for mild constipation.    . fluconazole (DIFLUCAN) 150 MG tablet Take 150 mg by mouth once. To prevent yeast infection while on antibiotics    . hyoscyamine (LEVSIN, ANASPAZ) 0.125 MG tablet Take 0.125-0.25 mg by mouth every 6 (six) hours as needed for cramping.   0  . ondansetron (ZOFRAN-ODT) 8 MG disintegrating tablet Take 8 mg by mouth every 8 (eight) hours as needed for nausea.   0  . oxyCODONE (OXY IR/ROXICODONE) 5 MG immediate release tablet Take 5 mg by mouth every 6 (six) hours as needed for moderate pain or severe pain.   0  . ranitidine (ZANTAC) 300 MG tablet Take 300 mg by mouth 2 (two) times daily as needed for heartburn.   0    Allergies: No Known Allergies  No family history on file.  Social History:  reports that she has never smoked. She has  never used smokeless tobacco. She reports that she drinks alcohol. She reports that she does not use illicit drugs.  Review of Systems: negative except mild constipation   Blood pressure 102/86, pulse 80, temperature 98 F (36.7 C), temperature source Oral, resp. rate 17, height 6' (1.829 m), weight 58.151 kg (128 lb 3.2 oz), last menstrual period 10/14/2013, SpO2 100 %. Head: Normocephalic, without obvious abnormality, atraumatic Neck: no adenopathy, no carotid bruit, no JVD, supple, symmetrical, trachea midline and thyroid not  enlarged, symmetric, no tenderness/mass/nodules Resp: clear to auscultation bilaterally Cardio: regular rate and rhythm, S1, S2 normal, no murmur, click, rub or gallop GI: abdomen slightly distendedsemitaut, there is fairly widespread tenderness to light palpation with voluntary guarding throughout most of the abdomen. No masses are felt. Extremities: extremities normal, atraumatic, no cyanosis or edema  Results for orders placed or performed during the hospital encounter of 11/20/13 (from the past 48 hour(s))  CBC with Differential     Status: Abnormal   Collection Time: 11/20/13  1:49 AM  Result Value Ref Range   WBC 9.9 4.0 - 10.5 K/uL   RBC 4.02 3.87 - 5.11 MIL/uL   Hemoglobin 12.2 12.0 - 15.0 g/dL   HCT 35.6 (L) 36.0 - 46.0 %   MCV 88.6 78.0 - 100.0 fL   MCH 30.3 26.0 - 34.0 pg   MCHC 34.3 30.0 - 36.0 g/dL   RDW 12.5 11.5 - 15.5 %   Platelets 179 150 - 400 K/uL   Neutrophils Relative % 77 43 - 77 %   Neutro Abs 7.6 1.7 - 7.7 K/uL   Lymphocytes Relative 11 (L) 12 - 46 %   Lymphs Abs 1.1 0.7 - 4.0 K/uL   Monocytes Relative 12 3 - 12 %   Monocytes Absolute 1.2 (H) 0.1 - 1.0 K/uL   Eosinophils Relative 0 0 - 5 %   Eosinophils Absolute 0.0 0.0 - 0.7 K/uL   Basophils Relative 0 0 - 1 %   Basophils Absolute 0.0 0.0 - 0.1 K/uL  Comprehensive metabolic panel     Status: Abnormal   Collection Time: 11/20/13  1:49 AM  Result Value Ref Range   Sodium 138 137 - 147 mEq/L   Potassium 3.6 (L) 3.7 - 5.3 mEq/L   Chloride 100 96 - 112 mEq/L   CO2 23 19 - 32 mEq/L   Glucose, Bld 100 (H) 70 - 99 mg/dL   BUN 5 (L) 6 - 23 mg/dL   Creatinine, Ser 0.72 0.50 - 1.10 mg/dL   Calcium 9.3 8.4 - 10.5 mg/dL   Total Protein 8.0 6.0 - 8.3 g/dL   Albumin 4.1 3.5 - 5.2 g/dL   AST 23 0 - 37 U/L   ALT 17 0 - 35 U/L   Alkaline Phosphatase 68 39 - 117 U/L   Total Bilirubin 1.2 0.3 - 1.2 mg/dL   GFR calc non Af Amer >90 >90 mL/min   GFR calc Af Amer >90 >90 mL/min    Comment: (NOTE) The eGFR has been  calculated using the CKD EPI equation. This calculation has not been validated in all clinical situations. eGFR's persistently <90 mL/min signify possible Chronic Kidney Disease.    Anion gap 15 5 - 15  Urinalysis, Routine w reflex microscopic     Status: Abnormal   Collection Time: 11/20/13  2:06 AM  Result Value Ref Range   Color, Urine YELLOW YELLOW   APPearance CLOUDY (A) CLEAR   Specific Gravity, Urine 1.015 1.005 - 1.030  pH 7.5 5.0 - 8.0   Glucose, UA NEGATIVE NEGATIVE mg/dL   Hgb urine dipstick MODERATE (A) NEGATIVE   Bilirubin Urine NEGATIVE NEGATIVE   Ketones, ur 40 (A) NEGATIVE mg/dL   Protein, ur 100 (A) NEGATIVE mg/dL   Urobilinogen, UA 0.2 0.0 - 1.0 mg/dL   Nitrite NEGATIVE NEGATIVE   Leukocytes, UA MODERATE (A) NEGATIVE  Pregnancy, urine     Status: None   Collection Time: 11/20/13  2:06 AM  Result Value Ref Range   Preg Test, Ur NEGATIVE NEGATIVE    Comment:        THE SENSITIVITY OF THIS METHODOLOGY IS >20 mIU/mL.   Urine culture     Status: None   Collection Time: 11/20/13  2:06 AM  Result Value Ref Range   Specimen Description URINE, CLEAN CATCH    Special Requests NONE    Culture  Setup Time      11/20/2013 11:02 Performed at [place]      9,000 COLONIES/ML Performed at [facility]    Culture      INSIGNIFICANT GROWTH Performed at [facility]    Report Status 11/21/2013 FINAL   Urine microscopic-add on     Status: Abnormal   Collection Time: 11/20/13  2:06 AM  Result Value Ref Range   Squamous Epithelial / LPF MANY (A) RARE   WBC, UA TOO NUMEROUS TO COUNT <3 WBC/hpf   RBC / HPF 7-10 <3 RBC/hpf   Bacteria, UA FEW (A) RARE  Comprehensive metabolic panel     Status: Abnormal   Collection Time: 11/20/13  5:53 AM  Result Value Ref Range   Sodium 140 137 - 147 mEq/L   Potassium 4.1 3.7 - 5.3 mEq/L   Chloride 104 96 - 112 mEq/L   CO2 21 19 - 32 mEq/L   Glucose, Bld 91 70 - 99 mg/dL   BUN 5 (L) 6  - 23 mg/dL   Creatinine, Ser 0.72 0.50 - 1.10 mg/dL   Calcium 8.3 (L) 8.4 - 10.5 mg/dL   Total Protein 7.1 6.0 - 8.3 g/dL   Albumin 3.5 3.5 - 5.2 g/dL   AST 22 0 - 37 U/L   ALT 16 0 - 35 U/L   Alkaline Phosphatase 59 39 - 117 U/L   Total Bilirubin 0.7 0.3 - 1.2 mg/dL   GFR calc non Af Amer >90 >90 mL/min   GFR calc Af Amer >90 >90 mL/min    Comment: (NOTE) The eGFR has been calculated using the CKD EPI equation. This calculation has not been validated in all clinical situations. eGFR's persistently <90 mL/min signify possible Chronic Kidney Disease.    Anion gap 15 5 - 15  CBC     Status: Abnormal   Collection Time: 11/20/13  5:53 AM  Result Value Ref Range   WBC 7.7 4.0 - 10.5 K/uL   RBC 3.88 3.87 - 5.11 MIL/uL   Hemoglobin 11.5 (L) 12.0 - 15.0 g/dL   HCT 35.3 (L) 36.0 - 46.0 %   MCV 91.0 78.0 - 100.0 fL   MCH 29.6 26.0 - 34.0 pg   MCHC 32.6 30.0 - 36.0 g/dL   RDW 12.6 11.5 - 15.5 %   Platelets 156 150 - 400 K/uL  Protime-INR     Status: None   Collection Time: 11/20/13  5:53 AM  Result Value Ref Range   Prothrombin Time 14.6 11.6 - 15.2 seconds   INR 1.13 0.00 -  1.49  Lactic acid, plasma     Status: None   Collection Time: 11/20/13  5:53 AM  Result Value Ref Range   Lactic Acid, Venous 1.4 0.5 - 2.2 mmol/L  Culture, blood (routine x 2)     Status: None (Preliminary result)   Collection Time: 11/20/13  6:08 AM  Result Value Ref Range   Specimen Description BLOOD RIGHT HAND    Special Requests BOTTLES DRAWN AEROBIC ONLY 5CC    Culture  Setup Time      11/20/2013 11:00 Performed at [place] NO GROWTH TO DATE CULTURE WILL BE HELD FOR 5 DAYS BEFORE ISSUING A FINAL NEGATIVE REPORT Performed at [facility]    Report Status PENDING   Culture, blood (routine x 2)     Status: None (Preliminary result)   Collection Time: 11/20/13  6:18 AM  Result Value Ref Range   Specimen Description BLOOD LEFT ARM     Special Requests BOTTLES DRAWN AEROBIC AND ANAEROBIC 10CC EACH    Culture  Setup Time      11/20/2013 11:00 Performed at [place] NO GROWTH TO DATE CULTURE WILL BE HELD FOR 5 DAYS BEFORE ISSUING A FINAL NEGATIVE REPORT Performed at [facility]    Report Status PENDING    Ct Abdomen Pelvis W Contrast  11/21/2013   CLINICAL DATA:  Right-sided abdominal pain for 3 days  EXAM: CT ABDOMEN AND PELVIS WITH CONTRAST  TECHNIQUE: Multidetector CT imaging of the abdomen and pelvis was performed using the standard protocol following bolus administration of intravenous contrast.  CONTRAST:  67m OMNIPAQUE IOHEXOL 300 MG/ML  SOLN  COMPARISON:  08/18/2009, 11/04/2013  FINDINGS: Lung bases are well aerated with very minimal left basilar atelectasis posteriorly.  The liver shows evidence of a few scattered hypodensities likely representing cysts but incompletely evaluated on this exam. They are however stable from the prior study in 2011. The gallbladder, spleen, adrenal glands and pancreas are all normal in their CT appearance. The kidneys demonstrate a normal enhancement pattern. No renal calculi or obstructive changes are seen.  The appendix is well filled with barium. The bladder is well distended. No pelvic mass lesion is seen ovarian cystic changes are noted on the left. Minimal free fluid is noted within the pelvis likely physiologic in nature. No bony abnormality is noted.  IMPRESSION: Left ovarian cystic change.  Stable hypodensities within the liver likely representing cysts.  No other focal abnormality is seen.   Electronically Signed   By: MInez CatalinaM.D.   On: 11/21/2013 14:20   Dg Abd Acute W/chest  11/20/2013   CLINICAL DATA:  Abdominal pain.  EXAM: ACUTE ABDOMEN SERIES (ABDOMEN 2 VIEW & CHEST 1 VIEW)  COMPARISON:  CT abdomen and pelvis 08/18/2009  FINDINGS: Pulmonary hyperinflation. Normal heart size and pulmonary vascularity. No  focal airspace disease or consolidation in the lungs. No blunting of costophrenic angles. No pneumothorax. Mediastinal contours appear intact.  Scattered gas and stool in the colon. No small or large bowel distention. No free intra-abdominal air. No abnormal air-fluid levels. No radiopaque stones. Visualized bones appear intact.  IMPRESSION: No evidence of active pulmonary disease. Nonobstructive bowel gas pattern.   Electronically Signed   By: WLucienne CapersM.D.   On: 11/20/2013  02:55    Assessment: Recurrent nausea vomiting and diffuse abdominal pain after a course of pyelonephritis which apparentlynot felt to explain her symptomatology even though she had too numerous to count WBCs on her urinalysis.urine cultures and blood cultures negative. CT of abdomen and pelvis unremarkable. Plan:   Will check serum lipase level.  Given multiple negative studies and an failure to respond to high-dose antibiotics will proceed with EGD although I expect this will be low yield.  Jaylanni Eltringham C 11/21/2013, 2:52 PM

## 2013-11-22 NOTE — Interval H&P Note (Signed)
History and Physical Interval Note:  11/22/2013 10:06 AM  Danielle [name]  has presented today for surgery, with the diagnosis of nausea vomiting and abdominal pain  The various methods of treatment have been discussed with the patient and family. After consideration of risks, benefits and other options for treatment, the patient has consented to  Procedure(s): ESOPHAGOGASTRODUODENOSCOPY (EGD) (N/A) as a surgical intervention .  The patient's history has been reviewed, patient examined, no change in status, stable for surgery.  I have reviewed the patient's chart and labs.  Questions were answered to the patient's satisfaction.     Maximo Spratling JR,Yocheved Depner L

## 2013-11-22 NOTE — Progress Notes (Signed)
[facility] Progress Note   Danielle [name] [id number] DOB: 02/08/1985 DOA: 11/20/2013 PCP: Lolita PatellaEADE,ROBERT ALEXANDER, MD  Brief narrative: Danielle [name] is a 28 y.o. female who presented with vomiting and abdominal and flank pain. She was recently treated for [facility] and had completed her antibiotics before these symptoms occured.    Subjective: Burning in chest significantly improved with Protonix. Flank and abdominal pain controlled with Fentanyl.   Assessment/Plan: Active Problems:   Abdominal pain/ regurgitation - CT abd/pelvis unrevealing - s/p EGD which reveals esophagitis - cont PPI and H2 blocker - started full liquids - appreciateGI eval  Pyelonephritis - Urine culture obtained in PCPs office reveals 50-100,000 colonies of multiple morphotypes- - unfortunately, will need to treat empirically- will add Cipro to Zosyn to cover for resistant organisms - attempt to transition to oral pain meds today.    Code Status: Full code Family Communication: with mother Disposition Plan: home once stable DVT prophylaxis: Lovenox  Consultants: GI  Procedures: none  Antibiotics: Anti-infectives    Start     Dose/Rate Route Frequency Ordered Stop   11/22/13 1500  ciprofloxacin (CIPRO) IVPB 400 mg     400 mg200 mL/hr over 60 Minutes Intravenous Every 12 hours 11/22/13 1446     11/22/13 1115  amoxicillin-clavulanate (AUGMENTIN) 875-125 MG per tablet 1 tablet  Status:  Discontinued     1 tablet Oral 2 times daily 11/22/13 1102 11/22/13 1211   11/22/13 1115  ciprofloxacin (CIPRO) tablet 500 mg  Status:  Discontinued     500 mg Oral Every 12 hours 11/22/13 1102 11/22/13 1211   11/22/13 1115  fluconazole (DIFLUCAN) tablet 150 mg  Status:  Discontinued     150 mg Oral  Once 11/22/13 1102 11/22/13 1212   11/20/13 0600  piperacillin-tazobactam (ZOSYN) IVPB 3.375 g     3.375 g12.5 mL/hr over 240 Minutes Intravenous 3 times per day 11/20/13  0518     11/20/13 0400  cefTRIAXone (ROCEPHIN) 1 g in dextrose 5 % 50 mL IVPB     1 g100 mL/hr over 30 Minutes Intravenous  Once 11/20/13 0346 11/20/13 0432         Objective: Filed Weights   11/20/13 1545 11/21/13 0529 11/22/13 0507  Weight: 58.06 kg (128 lb) 58.151 kg (128 lb 3.2 oz) 62.097 kg (136 lb 14.4 oz)    Intake/Output Summary (Last 24 hours) at 11/22/13 1749 Last data filed at 11/22/13 1739  Gross per 24 hour  Intake 5363.75 ml  Output      4 ml  Net 5359.75 ml     Vitals Filed Vitals:   11/22/13 1020 11/22/13 1025 11/22/13 1030 11/22/13 1253  BP: 127/92 105/63  110/70  Pulse: 107 66 63 72  Temp:    97.9 F (36.6 C)  TempSrc:    Oral  Resp: 32 17 17 19   Height:      Weight:      SpO2: 100% 100% 100% 100%    Exam: General: No acute respiratory distress Lungs: Clear to auscultation bilaterally without wheezes or crackles Cardiovascular: Regular rate and rhythm without murmur gallop or rub normal S1 and S2 Abdomen: tenderness improved today- out,  Mildly distended, soft, bowel sounds positive, no rebound, no ascites, no appreciable mass Extremities: No significant cyanosis, clubbing, or edema bilateral lower extremities  Data Reviewed: Basic Metabolic Panel:  Recent Labs Lab 11/20/13 0149 11/20/13 0553  NA 138 140  K 3.6* 4.1  CL 100 104  CO2 23 21  GLUCOSE 100* 91  BUN 5* 5*  CREATININE 0.72 0.72  CALCIUM 9.3 8.3*   Liver Function Tests:  Recent Labs Lab 11/20/13 0149 11/20/13 0553  AST 23 22  ALT 17 16  ALKPHOS 68 59  BILITOT 1.2 0.7  PROT 8.0 7.1  ALBUMIN 4.1 3.5    Recent Labs Lab 11/21/13 1705  LIPASE 15   No results for input(s): AMMONIA in the last 168 hours. CBC:  Recent Labs Lab 11/20/13 0149 11/20/13 0553  WBC 9.9 7.7  NEUTROABS 7.6  --   HGB 12.2 11.5*  HCT 35.6* 35.3*  MCV 88.6 91.0  PLT 179 156   Cardiac Enzymes: No results for input(s): CKTOTAL, CKMB, CKMBINDEX, TROPONINI in the last 168 hours. BNP  (last 3 results) No results for input(s): PROBNP in the last 8760 hours. CBG: No results for input(s): GLUCAP in the last 168 hours.  Recent Results (from the past 240 hour(s))  Urine culture     Status: None   Collection Time: 11/20/13  2:06 AM  Result Value Ref Range Status   Specimen Description URINE, CLEAN CATCH  Final   Special Requests NONE  Final   Culture  Setup Time   Final    11/20/2013 11:02 Performed at [facility]    Colony Count   Final    9,000 COLONIES/ML [facility]formed at [facility]    Culture   Final    INSIGNIFICANT GROWTH Performed at [facility]    Report Status 11/21/2013 FINAL  Final  Culture, blood (routine x 2)     Status: None (Preliminary result)   Collection Time: 11/20/13  6:08 AM  Result Value Ref Range Status   Specimen Description BLOOD RIGHT HAND  Final   Special Requests BOTTLES DRAWN AEROBIC ONLY 5CC  Final   Culture  Setup Time   Final    11/20/2013 11:00 [facility]formed at [facility]    Culture   Final           BLOOD CULTURE RECEIVED NO GROWTH TO DATE CULTURE WILL BE HELD FOR 5 DAYS BEFORE ISSUING A FINAL NEGATIVE REPORT Performed at [facility]    Report Status PENDING  Incomplete  Culture, blood (routine x 2)     Status: None (Preliminary result)   Collection Time: 11/20/13  6:18 AM  Result Value Ref Range Status   Specimen Description BLOOD LEFT ARM  Final   Special Requests BOTTLES DRAWN AEROBIC AND ANAEROBIC 10CC EACH  Final   Culture  Setup Time   Final    11/20/2013 11:00 Performed at [facility]    Culture   Final           BLOOD CULTURE RECEIVED NO GROWTH TO DATE CULTURE WILL BE HELD FOR 5 DAYS BEFORE ISSUING A FINAL NEGATIVE REPORT Performed at [facility]    Report Status PENDING  Incomplete     Studies:  Recent x-ray studies have been reviewed in detail by the Attending Physician  Scheduled Meds:  Scheduled Meds: . ciprofloxacin  400 mg Intravenous Q12H   . diclofenac sodium  4 g Topical QID  . enoxaparin (LOVENOX) injection  40 mg Subcutaneous Q24H  . famotidine (PEPCID) IV  20 mg Intravenous Q12H  . feeding supplement (RESOURCE BREEZE)  1 Container Oral TID BM  . pantoprazole (PROTONIX) IV  40 mg Intravenous Q12H  . piperacillin-tazobactam (ZOSYN)  IV  3.375 g Intravenous 3 times per day  . sodium chloride  3 mL Intravenous Q12H   Continuous Infusions: . sodium chloride 125 mL/hr at 11/22/13 1119    Time spent on care of this patient: 35 min   Fredrica Capano, MD 11/22/2013, 5:49 PM  LOS: 2 days   [facility] Office  [phone] Pager - Text Page per [email or web address]  If 7PM-7AM, please contact night-coverage [email or web address]

## 2013-11-23 LAB — CBC
HCT: 29.8 % — ABNORMAL LOW (ref 36.0–46.0)
Hemoglobin: 10.1 g/dL — ABNORMAL LOW (ref 12.0–15.0)
MCH: 29 pg (ref 26.0–34.0)
MCHC: 33.9 g/dL (ref 30.0–36.0)
MCV: 85.6 fL (ref 78.0–100.0)
PLATELETS: 190 10*3/uL (ref 150–400)
RBC: 3.48 MIL/uL — AB (ref 3.87–5.11)
RDW: 12.4 % (ref 11.5–15.5)
WBC: 5.2 10*3/uL (ref 4.0–10.5)

## 2013-11-23 LAB — CLOSTRIDIUM DIFFICILE BY PCR: CDIFFPCR: NEGATIVE

## 2013-11-23 MED ORDER — FAMOTIDINE 20 MG PO TABS
20.0000 mg | ORAL_TABLET | Freq: Two times a day (BID) | ORAL | Status: DC
  Administered 2013-11-23 – 2013-11-26 (×6): 20 mg via ORAL
  Filled 2013-11-23 (×8): qty 1

## 2013-11-23 MED ORDER — PANTOPRAZOLE SODIUM 40 MG PO TBEC
40.0000 mg | DELAYED_RELEASE_TABLET | Freq: Two times a day (BID) | ORAL | Status: DC
  Administered 2013-11-23 – 2013-11-26 (×6): 40 mg via ORAL
  Filled 2013-11-23 (×6): qty 1

## 2013-11-23 MED ORDER — DIPHENOXYLATE-ATROPINE 2.5-0.025 MG/5ML PO LIQD: 5.0000 mL | Freq: Four times a day (QID) | ORAL | Status: DC | PRN

## 2013-11-23 MED ORDER — DIPHENOXYLATE-ATROPINE 2.5-0.025 MG PO TABS
1.0000 | ORAL_TABLET | Freq: Four times a day (QID) | ORAL | Status: DC | PRN
  Administered 2013-11-23 (×2): 1 via ORAL
  Filled 2013-11-23 (×2): qty 1

## 2013-11-23 NOTE — Progress Notes (Addendum)
[facility] Progress Note   Danielle [name] ZHY:[id number] DOB: [birth year] DOA: 11/20/2013 PCP: Lolita Patella, MD  Brief narrative: Danielle [name] is a 28 y.o. female who presented with vomiting and abdominal and flank pain. She was recently treated for pyelonephritis at [facility] and had completed her antibiotics before these symptoms occured.    Subjective: Having trouble tolerating solid food.   Assessment/Plan: Active Problems:   Abdominal pain/ regurgitation - CT abd/pelvis unrevealing - s/p EGD which reveals esophagitis - cont PPI and H2 blocker - started full liquids which she is tolerating well therefore will advance to soft diet - appreciate GI eval  Pyelonephritis - Urine culture obtained in PCPs office reveals 50-100,000 colonies of multiple morphotypes- - unfortunately, will need to treat empirically- continue Cipro to Zosyn to cover for resistant organisms - pain significantly improved  Code Status: Full code Family Communication: with mother Disposition Plan: home once stable DVT prophylaxis: Lovenox  Consultants: GI  Procedures: none  Antibiotics: Anti-infectives    Start     Dose/Rate Route Frequency Ordered Stop   11/22/13 1500  ciprofloxacin (CIPRO) IVPB 400 mg     400 mg200 mL/hr over 60 Minutes Intravenous Every 12 hours 11/22/13 1446     11/22/13 1115  amoxicillin-clavulanate (AUGMENTIN) 875-125 MG per tablet 1 tablet  Status:  Discontinued     1 tablet Oral 2 times daily 11/22/13 1102 11/22/13 1211   11/22/13 1115  ciprofloxacin (CIPRO) tablet 500 mg  Status:  Discontinued     500 mg Oral Every 12 hours 11/22/13 1102 11/22/13 1211   11/22/13 1115  fluconazole (DIFLUCAN) tablet 150 mg  Status:  Discontinued     150 mg Oral  Once 11/22/13 1102 11/22/13 1212   11/20/13 0600  piperacillin-tazobactam (ZOSYN) IVPB 3.375 g     3.375 g12.5 mL/hr over 240 Minutes Intravenous 3 times per day 11/20/13 0518     11/20/13 0400   cefTRIAXone (ROCEPHIN) 1 g in dextrose 5 % 50 mL IVPB     1 g100 mL/hr over 30 Minutes Intravenous  Once 11/20/13 0346 11/20/13 0432         Objective: Filed Weights   11/20/13 1545 11/21/13 0529 11/22/13 0507  Weight: 58.06 kg (128 lb) 58.151 kg (128 lb 3.2 oz) 62.097 kg (136 lb 14.4 oz)    Intake/Output Summary (Last 24 hours) at 11/23/13 1425 Last data filed at 11/23/13 1000  Gross per 24 hour  Intake 2301.67 ml  Output      6 ml  Net 2295.67 ml     Vitals Filed Vitals:   11/22/13 1030 11/22/13 1253 11/22/13 2142 11/23/13 0531  BP:  110/70 113/69 110/66  Pulse: 63 72 85 62  Temp:  97.9 F (36.6 C) 98.4 F (36.9 C) 97.9 F (36.6 C)  TempSrc:  Oral Oral   Resp: 17 19 18 18   Height:      Weight:      SpO2: 100% 100% 100% 100%    Exam: General: No acute respiratory distress Lungs: Clear to auscultation bilaterally without wheezes or crackles Cardiovascular: Regular rate and rhythm without murmur gallop or rub normal S1 and S2 Abdomen: tenderness improving daily-  distention improved, soft, bowel sounds positive, no rebound, no ascites, no appreciable mass Extremities: No significant cyanosis, clubbing, or edema bilateral lower extremities  Data Reviewed: Basic Metabolic Panel:  Recent Labs Lab 11/20/13 0149 11/20/13 0553  NA 138 140  K 3.6* 4.1  CL 100 104  CO2 23 21  GLUCOSE 100* 91  BUN 5* 5*  CREATININE 0.72 0.72  CALCIUM 9.3 8.3*   Liver Function Tests:  Recent Labs Lab 11/20/13 0149 11/20/13 0553  AST 23 22  ALT 17 16  ALKPHOS 68 59  BILITOT 1.2 0.7  PROT 8.0 7.1  ALBUMIN 4.1 3.5    Recent Labs Lab 11/21/13 1705  LIPASE 15   No results for input(s): AMMONIA in the last 168 hours. CBC:  Recent Labs Lab 11/20/13 0149 11/20/13 0553 11/23/13 0500  WBC 9.9 7.7 5.2  NEUTROABS 7.6  --   --   HGB 12.2 11.5* 10.1*  HCT 35.6* 35.3* 29.8*  MCV 88.6 91.0 85.6  PLT 179 156 190   Cardiac Enzymes: No results for input(s): CKTOTAL,  CKMB, CKMBINDEX, TROPONINI in the last 168 hours. BNP (last 3 results) No results for input(s): PROBNP in the last 8760 hours. CBG: No results for input(s): GLUCAP in the last 168 hours.  Recent Results (from the past 240 hour(s))  Urine culture     Status: None   Collection Time: 11/20/13  2:06 AM  Result Value Ref Range Status   Specimen Description URINE, CLEAN CATCH  Final   Special Requests NONE  Final   Culture  Setup Time   Final    11/20/2013 11:02 Performed at [facility]    Colony Count   Final    9,000 COLONIES/ML [facility]formed at [facility]    Culture   Final    INSIGNIFICANT GROWTH Performed at [facility]    Report Status 11/21/2013 FINAL  Final  Culture, blood (routine x 2)     Status: None (Preliminary result)   Collection Time: 11/20/13  6:08 AM  Result Value Ref Range Status   Specimen Description BLOOD RIGHT HAND  Final   Special Requests BOTTLES DRAWN AEROBIC ONLY 5CC  Final   Culture  Setup Time   Final    11/20/2013 11:00 Performed at [facility]    Culture   Final           BLOOD CULTURE RECEIVED NO GROWTH TO DATE CULTURE WILL BE HELD FOR 5 DAYS BEFORE ISSUING A FINAL NEGATIVE REPORT Performed at [facility]    Report Status PENDING  Incomplete  Culture, blood (routine x 2)     Status: None (Preliminary result)   Collection Time: 11/20/13  6:18 AM  Result Value Ref Range Status   Specimen Description BLOOD LEFT ARM  Final   Special Requests BOTTLES DRAWN AEROBIC AND ANAEROBIC 10CC EACH  Final   Culture  Setup Time   Final    11/20/2013 11:00 Performed at [facility]    Culture   Final           BLOOD CULTURE RECEIVED NO GROWTH TO DATE CULTURE WILL BE HELD FOR 5 DAYS BEFORE ISSUING A FINAL NEGATIVE REPORT Performed at [facility]    Report Status PENDING  Incomplete  Clostridium Difficile by PCR     Status: None   Collection Time: 11/23/13 12:04 AM  Result Value Ref Range Status    C difficile by pcr NEGATIVE NEGATIVE Final     Studies:  Recent x-ray studies have been reviewed in detail by the Attending Physician  Scheduled Meds:  Scheduled Meds: . ciprofloxacin  400 mg Intravenous Q12H  . diclofenac sodium  4 g Topical QID  . enoxaparin (LOVENOX) injection  40 mg Subcutaneous Q24H  . famotidine  20 mg Oral  BID  . feeding supplement (RESOURCE BREEZE)  1 Container Oral TID BM  . pantoprazole  40 mg Oral BID  . piperacillin-tazobactam (ZOSYN)  IV  3.375 g Intravenous 3 times per day  . sodium chloride  3 mL Intravenous Q12H   Continuous Infusions: . sodium chloride 125 mL/hr at 11/23/13 0506    Time spent on care of this patient: 35 min   Sriyan Cutting, MD 11/23/2013, 2:25 PM  LOS: 3 days   [facility] Office  [phone] Pager - Text Page per [email or web address]  If 7PM-7AM, please contact night-coverage [email or web address]

## 2013-11-23 NOTE — Progress Notes (Signed)
[facility] PROGRESS NOTE Subjective Tolerating FLs w/o problems. Path pending  Objective: Vital signs in last 24 hours: Temp:  [97.9 F (36.6 C)-98.4 F (36.9 C)] 97.9 F (36.6 C) (11/08 0531) Pulse Rate:  [62-107] 62 (11/08 0531) Resp:  [17-32] 18 (11/08 0531) BP: (105-127)/(63-92) 110/66 mmHg (11/08 0531) SpO2:  [100 %] 100 % (11/08 0531) Last BM Date: 11/23/13  Intake/Output from previous day: 11/07 0701 - 11/08 0700 In: 2191.7 [I.V.:1341.7; IV Piggyback:850] Out: 6 [Urine:5; Stool:1] Intake/Output this shift: Total I/O In: 360 [P.O.:360] Out: 3 [Urine:2; Stool:1]   Lab Results:  Recent Labs  11/23/13 0500  WBC 5.2  HGB 10.1*  HCT 29.8*  PLT 190   BMET No results for input(s): NA, K, CL, CO2, CREATININE in the last 72 hours. LFT No results for input(s): PROT, AST, ALT, ALKPHOS, BILITOT, BILIDIR, IBILI in the last 72 hours. PT/INR No results for input(s): LABPROT, INR in the last 72 hours. PANCREAS  Recent Labs  11/21/13 1705  LIPASE 15         Studies/Results: Ct Abdomen Pelvis W Contrast  11/21/2013   CLINICAL DATA:  Right-sided abdominal pain for 3 days  EXAM: CT ABDOMEN AND PELVIS WITH CONTRAST  TECHNIQUE: Multidetector CT imaging of the abdomen and pelvis was performed using the standard protocol following bolus administration of intravenous contrast.  CONTRAST:  80mL OMNIPAQUE IOHEXOL 300 MG/ML  SOLN  COMPARISON:  08/18/2009, 11/04/2013  FINDINGS: Lung bases are well aerated with very minimal left basilar atelectasis posteriorly.  The liver shows evidence of a few scattered hypodensities likely representing cysts but incompletely evaluated on this exam. They are however stable from the prior study in 2011. The gallbladder, spleen, adrenal glands and pancreas are all normal in their CT appearance. The kidneys demonstrate a normal enhancement pattern. No renal calculi or obstructive changes are seen.  The appendix is well filled with barium.  The bladder is well distended. No pelvic mass lesion is seen ovarian cystic changes are noted on the left. Minimal free fluid is noted within the pelvis likely physiologic in nature. No bony abnormality is noted.  IMPRESSION: Left ovarian cystic change.  Stable hypodensities within the liver likely representing cysts.  No other focal abnormality is seen.   Electronically Signed   By: Alcide CleverMark  Lukens M.D.   On: 11/21/2013 14:20    Medications: I have reviewed the patient's current medications.  Assessment/Plan: 1. N+V. ? Due to UTI, reflux or both would continue PPI and slowly advance diet.   Trev Boley JR,Kasson Lamere L 11/23/2013, 10:18 AM

## 2013-11-24 ENCOUNTER — Encounter (HOSPITAL_COMMUNITY): Payer: Self-pay | Admitting: Gastroenterology

## 2013-11-24 DIAGNOSIS — K209 Esophagitis, unspecified without bleeding: Secondary | ICD-10-CM

## 2013-11-24 DIAGNOSIS — R109 Unspecified abdominal pain: Secondary | ICD-10-CM | POA: Insufficient documentation

## 2013-11-24 DIAGNOSIS — N10 Acute tubulo-interstitial nephritis: Secondary | ICD-10-CM

## 2013-11-24 DIAGNOSIS — R100 Acute abdomen: Secondary | ICD-10-CM

## 2013-11-24 MED ORDER — CIPROFLOXACIN HCL 500 MG PO TABS
500.0000 mg | ORAL_TABLET | Freq: Two times a day (BID) | ORAL | Status: DC
  Administered 2013-11-24 – 2013-11-26 (×5): 500 mg via ORAL
  Filled 2013-11-24 (×6): qty 1

## 2013-11-24 MED ORDER — ALUM & MAG HYDROXIDE-SIMETH 200-200-20 MG/5ML PO SUSP: 15.0000 mL | ORAL | Status: DC | PRN

## 2013-11-24 MED ORDER — AMOXICILLIN-POT CLAVULANATE 875-125 MG PO TABS
1.0000 | ORAL_TABLET | Freq: Two times a day (BID) | ORAL | Status: DC
  Administered 2013-11-24 – 2013-11-26 (×5): 1 via ORAL
  Filled 2013-11-24 (×6): qty 1

## 2013-11-24 NOTE — Progress Notes (Signed)
[facility] PROGRESS NOTE Subjective Patient states that her nausea is better. She did not vomit overnight  Objective: Vital signs in last 24 hours: Temp:  [97.6 F (36.4 C)-98 F (36.7 C)] 97.6 F (36.4 C) (11/09 0502) Pulse Rate:  [53-84] 58 (11/09 0502) Resp:  [17-18] 17 (11/09 0502) BP: (107-119)/(62-79) 107/62 mmHg (11/09 0502) SpO2:  [99 %-100 %] 100 % (11/09 0502) Weight:  [63.73 kg (140 lb 8 oz)] 63.73 kg (140 lb 8 oz) (11/09 0502) Last BM Date: 11/23/13  Intake/Output from previous day: 11/08 0701 - 11/09 0700 In: 4558.2 [P.O.:1404; I.V.:2754.2; IV Piggyback:400] Out: 4 [Urine:2; Stool:2] Intake/Output this shift:    PE: General--sitting in bed and absolutely no distress  Abdomen--soft and nontender  Lab Results:  Recent Labs  11/23/13 0500  WBC 5.2  HGB 10.1*  HCT 29.8*  PLT 190   BMET No results for input(s): NA, K, CL, CO2, CREATININE in the last 72 hours. LFT No results for input(s): PROT, AST, ALT, ALKPHOS, BILITOT, BILIDIR, IBILI in the last 72 hours. PT/INR No results for input(s): LABPROT, INR in the last 72 hours. PANCREAS  Recent Labs  11/21/13 1705  LIPASE 15         Studies/Results: No results found.  Medications: I have reviewed the patient's current medications.  Assessment/Plan: 1. Nausea and vomiting of unclear cause. Probably due to urinary tract infection and/or esophageal reflux or combination of the 2. She is doing better and I think could be discharged on aggressive PPI therapy with BID PPI, bland diet and follow-up in the office with Dr. Madilyn FiremanHayes in 1 to 2 weeks.   Danielle [name],Danielle [name] 11/24/2013, 8:48 AM

## 2013-11-24 NOTE — Plan of Care (Signed)
Problem: Phase I Progression Outcomes Goal: Pain controlled with appropriate interventions Outcome: Progressing Goal: OOB as tolerated unless otherwise ordered Outcome: Progressing Goal: Initial discharge plan identified Outcome: Progressing Goal: Voiding-avoid urinary catheter unless indicated Outcome: Progressing Goal: Hemodynamically stable Outcome: Progressing  Problem: Phase II Progression Outcomes Goal: Progress activity as tolerated unless otherwise ordered Outcome: Progressing Goal: Discharge plan established Outcome: Progressing Goal: Vital signs remain stable Outcome: Progressing Goal: IV changed to normal saline lock Outcome: Progressing  Problem: Phase III Progression Outcomes Goal: Pain controlled on oral analgesia Outcome: Progressing Goal: Activity at appropriate level-compared to baseline (UP IN CHAIR FOR HEMODIALYSIS)  Outcome: Progressing Goal: Voiding independently Outcome: Progressing Goal: IV/normal saline lock discontinued Outcome: Progressing Goal: Foley discontinued Outcome: Not Applicable Date Met:  44/92/01 Goal: Discharge plan remains appropriate-arrangements made Outcome: Progressing

## 2013-11-24 NOTE — Clinical Documentation Improvement (Signed)
  MD's, NP's, and PA's   Per nutritional consult note patient meets criteria for "underweight" status, BMI 17.4 .  If either diagnosis below is appropriate please document in notes.  Thank you  Possible Clinical conditions  Obesity   Underweight   Cachexia  Other condition  Cannot clinically determine      Nutritional Diagnostics: "Inadequate oral intake related to abdominal pain and nausea/vomiting as evidenced by reported wt loss. "   Recommended Treatment: "Resource Breeze po TID, each supplement provides 250 kcal and 9 grams of protein"   Thank You, Lavonda JumboLawanda J Jawan Chavarria ,RN Clinical Documentation Specialist:  [phone]  [facility]- Health Information Management

## 2013-11-25 DIAGNOSIS — N10 Acute tubulo-interstitial nephritis: Principal | ICD-10-CM

## 2013-11-25 DIAGNOSIS — K209 Esophagitis, unspecified: Secondary | ICD-10-CM

## 2013-11-25 MED ORDER — PANTOPRAZOLE SODIUM 40 MG PO TBEC: 40.0000 mg | DELAYED_RELEASE_TABLET | Freq: Two times a day (BID) | ORAL | Status: DC

## 2013-11-25 MED ORDER — ONDANSETRON 8 MG PO TBDP: 8.0000 mg | ORAL_TABLET | Freq: Three times a day (TID) | ORAL | Status: DC | PRN

## 2013-11-25 MED ORDER — DIPHENOXYLATE-ATROPINE 2.5-0.025 MG PO TABS: 1.0000 | ORAL_TABLET | Freq: Four times a day (QID) | ORAL | Status: DC | PRN

## 2013-11-25 MED ORDER — OXYCODONE HCL 5 MG PO TABS: 5.0000 mg | ORAL_TABLET | Freq: Four times a day (QID) | ORAL | Status: DC | PRN

## 2013-11-25 MED ORDER — DICLOFENAC SODIUM 1 % TD GEL: 4.0000 g | Freq: Four times a day (QID) | TRANSDERMAL | Status: DC

## 2013-11-25 MED ORDER — RANITIDINE HCL 300 MG PO TABS: 300.0000 mg | ORAL_TABLET | Freq: Two times a day (BID) | ORAL | Status: DC | PRN

## 2013-11-25 NOTE — Plan of Care (Signed)
Problem: Phase I Progression Outcomes Goal: Pain controlled with appropriate interventions Outcome: Progressing     

## 2013-11-25 NOTE — Plan of Care (Signed)
Problem: Phase I Progression Outcomes Goal: Voiding-avoid urinary catheter unless indicated Outcome: Completed/Met Date Met:  11/25/13     

## 2013-11-25 NOTE — Plan of Care (Signed)
Problem: Phase I Progression Outcomes Goal: OOB as tolerated unless otherwise ordered Outcome: Completed/Met Date Met:  11/25/13

## 2013-11-25 NOTE — Progress Notes (Signed)
[facility] Progress Note   Danielle [name] ZOX:[id number] DOB: 1985/04/05 DOA: 11/20/2013 PCP: Lolita Patella, MD  Brief narrative: Danielle [name] is a 28 y.o. female who presented with vomiting and abdominal and flank pain. She was recently treated for pyelonephritis at [facility] and had completed her antibiotics before these symptoms occured.    Subjective: Having trouble tolerating solid food.   Assessment/Plan: Active Problems:   Abdominal pain/ regurgitation - CT abd/pelvis unrevealing - s/p EGD which reveals esophagitis - cont PPI and H2 blocker - having trouble with tolerating solids- will give another 24 hrs to see if PO intake improved- will cont IVF for now.  - appreciate GI eval  Pyelonephritis - Urine culture obtained in PCPs office reveals 50-100,000 colonies of multiple morphotypes- - unfortunately, will need to treat empirically- continue Cipro to Zosyn to cover for resistant organisms - pain significantly improved  Anemia - patient states she is having bleeding but not sure if it is from rectum or if it is vaginal- she feels it may be vaginal due to menses -  will recheck Hgb in AM  Code Status: Full code Family Communication: with mother Disposition Plan: home once stable DVT prophylaxis: Lovenox  Consultants: GI  Procedures: none  Antibiotics: Anti-infectives    Start     Dose/Rate Route Frequency Ordered Stop   11/24/13 1300  amoxicillin-clavulanate (AUGMENTIN) 875-125 MG per tablet 1 tablet     1 tablet Oral 2 times daily 11/24/13 1228     11/24/13 1300  ciprofloxacin (CIPRO) tablet 500 mg     500 mg Oral Every 12 hours 11/24/13 1228     11/22/13 1500  ciprofloxacin (CIPRO) IVPB 400 mg  Status:  Discontinued     400 mg200 mL/hr over 60 Minutes Intravenous Every 12 hours 11/22/13 1446 11/24/13 1230   11/22/13 1115  amoxicillin-clavulanate (AUGMENTIN) 875-125 MG per tablet 1 tablet  Status:  Discontinued     1 tablet  Oral 2 times daily 11/22/13 1102 11/22/13 1211   11/22/13 1115  ciprofloxacin (CIPRO) tablet 500 mg  Status:  Discontinued     500 mg Oral Every 12 hours 11/22/13 1102 11/22/13 1211   11/22/13 1115  fluconazole (DIFLUCAN) tablet 150 mg  Status:  Discontinued     150 mg Oral  Once 11/22/13 1102 11/22/13 1212   11/20/13 0600  piperacillin-tazobactam (ZOSYN) IVPB 3.375 g  Status:  Discontinued     3.375 g12.5 mL/hr over 240 Minutes Intravenous 3 times per day 11/20/13 0518 11/24/13 1230   11/20/13 0400  cefTRIAXone (ROCEPHIN) 1 g in dextrose 5 % 50 mL IVPB     1 g100 mL/hr over 30 Minutes Intravenous  Once 11/20/13 0346 11/20/13 0432         Objective: Filed Weights   11/22/13 0507 11/24/13 0502 11/25/13 0515  Weight: 62.097 kg (136 lb 14.4 oz) 63.73 kg (140 lb 8 oz) 61.553 kg (135 lb 11.2 oz)    Intake/Output Summary (Last 24 hours) at 11/25/13 1546 Last data filed at 11/25/13 1300  Gross per 24 hour  Intake   2135 ml  Output      1 ml  Net   2134 ml     Vitals Filed Vitals:   11/24/13 1400 11/24/13 2142 11/25/13 0515 11/25/13 1500  BP: 113/79 130/96 112/77 127/92  Pulse: 70 63 61 72  Temp: 97.6 F (36.4 C) 97.4 F (36.3 C) 97.6 F (36.4 C) 98 F (36.7 C)  TempSrc: Oral Oral Oral Oral  Resp: 16 16 16 16   Height:      Weight:   61.553 kg (135 lb 11.2 oz)   SpO2: 99% 98% 98% 100%    Exam: General: No acute respiratory distress Lungs: Clear to auscultation bilaterally without wheezes or crackles Cardiovascular: Regular rate and rhythm without murmur gallop or rub normal S1 and S2 Abdomen: tenderness improving daily-  distention improved, soft, bowel sounds positive, no rebound, no ascites, no appreciable mass Extremities: No significant cyanosis, clubbing, or edema bilateral lower extremities  Data Reviewed: Basic Metabolic Panel:  Recent Labs Lab 11/20/13 0149 11/20/13 0553  NA 138 140  K 3.6* 4.1  CL 100 104  CO2 23 21  GLUCOSE 100* 91  BUN 5* 5*   CREATININE 0.72 0.72  CALCIUM 9.3 8.3*   Liver Function Tests:  Recent Labs Lab 11/20/13 0149 11/20/13 0553  AST 23 22  ALT 17 16  ALKPHOS 68 59  BILITOT 1.2 0.7  PROT 8.0 7.1  ALBUMIN 4.1 3.5    Recent Labs Lab 11/21/13 1705  LIPASE 15   No results for input(s): AMMONIA in the last 168 hours. CBC:  Recent Labs Lab 11/20/13 0149 11/20/13 0553 11/23/13 0500  WBC 9.9 7.7 5.2  NEUTROABS 7.6  --   --   HGB 12.2 11.5* 10.1*  HCT 35.6* 35.3* 29.8*  MCV 88.6 91.0 85.6  PLT 179 156 190   Cardiac Enzymes: No results for input(s): CKTOTAL, CKMB, CKMBINDEX, TROPONINI in the last 168 hours. BNP (last 3 results) No results for input(s): PROBNP in the last 8760 hours. CBG: No results for input(s): GLUCAP in the last 168 hours.  Recent Results (from the past 240 hour(s))  Urine culture     Status: None   Collection Time: 11/20/13  2:06 AM  Result Value Ref Range Status   Specimen Description URINE, CLEAN CATCH  Final   Special Requests NONE  Final   Culture  Setup Time   Final    11/20/2013 11:02 Performed at [facility]    Colony Count   Final    9,000 COLONIES/ML Performed at [facility]    Culture   Final    INSIGNIFICANT GROWTH Performed at [facility]    Report Status 11/21/2013 FINAL  Final  Culture, blood (routine x 2)     Status: None (Preliminary result)   Collection Time: 11/20/13  6:08 AM  Result Value Ref Range Status   Specimen Description BLOOD RIGHT HAND  Final   Special Requests BOTTLES DRAWN AEROBIC ONLY 5CC  Final   Culture  Setup Time   Final    11/20/2013 11:00 Performed at [facility]    Culture   Final           BLOOD CULTURE RECEIVED NO GROWTH TO DATE CULTURE WILL BE HELD FOR 5 DAYS BEFORE ISSUING A FINAL NEGATIVE REPORT Performed at [facility]    Report Status PENDING  Incomplete  Culture, blood (routine x 2)     Status: None (Preliminary result)   Collection Time: 11/20/13  6:18 AM   Result Value Ref Range Status   Specimen Description BLOOD LEFT ARM  Final   Special Requests BOTTLES DRAWN AEROBIC AND ANAEROBIC 10CC EACH  Final   Culture  Setup Time   Final    11/20/2013 11:00 Performed at [facility]    Culture   Final           BLOOD CULTURE RECEIVED NO  GROWTH TO DATE CULTURE WILL BE HELD FOR 5 DAYS BEFORE ISSUING A FINAL NEGATIVE REPORT Performed at [facility]    Report Status PENDING  Incomplete  Clostridium Difficile by PCR     Status: None   Collection Time: 11/23/13 12:04 AM  Result Value Ref Range Status   C difficile by pcr NEGATIVE NEGATIVE Final     Studies:  Recent x-ray studies have been reviewed in detail by the Attending Physician  Scheduled Meds:  Scheduled Meds: . amoxicillin-clavulanate  1 tablet Oral BID  . ciprofloxacin  500 mg Oral Q12H  . diclofenac sodium  4 g Topical QID  . enoxaparin (LOVENOX) injection  40 mg Subcutaneous Q24H  . famotidine  20 mg Oral BID  . pantoprazole  40 mg Oral BID  . sodium chloride  3 mL Intravenous Q12H   Continuous Infusions: . sodium chloride 75 mL/hr at 11/25/13 1047    Time spent on care of this patient: 35 min   Pocahontas Cohenour, MD 11/25/2013, 3:46 PM  LOS: 5 days   [facility] Office  [phone] Pager - Text Page per [email or web address]  If 7PM-7AM, please contact night-coverage [email or web address]

## 2013-11-25 NOTE — Progress Notes (Signed)
Pt noted some vaginal bleeding and thinks it is her period. Will monitor.

## 2013-11-26 DIAGNOSIS — E860 Dehydration: Secondary | ICD-10-CM | POA: Insufficient documentation

## 2013-11-26 DIAGNOSIS — R110 Nausea: Secondary | ICD-10-CM

## 2013-11-26 DIAGNOSIS — R112 Nausea with vomiting, unspecified: Secondary | ICD-10-CM | POA: Insufficient documentation

## 2013-11-26 LAB — CBC
HCT: 32.9 % — ABNORMAL LOW (ref 36.0–46.0)
Hemoglobin: 11.3 g/dL — ABNORMAL LOW (ref 12.0–15.0)
MCH: 29 pg (ref 26.0–34.0)
MCHC: 34.3 g/dL (ref 30.0–36.0)
MCV: 84.6 fL (ref 78.0–100.0)
PLATELETS: 243 10*3/uL (ref 150–400)
RBC: 3.89 MIL/uL (ref 3.87–5.11)
RDW: 12.6 % (ref 11.5–15.5)
WBC: 4.2 10*3/uL (ref 4.0–10.5)

## 2013-11-26 LAB — CULTURE, BLOOD (ROUTINE X 2)
CULTURE: NO GROWTH
Culture: NO GROWTH

## 2013-11-26 MED ORDER — SUCRALFATE 1 G PO TABS
1.0000 g | ORAL_TABLET | Freq: Three times a day (TID) | ORAL | Status: DC
  Administered 2013-11-26: 1 g via ORAL

## 2013-11-26 MED ORDER — SUCRALFATE 1 G PO TABS: 1.0000 g | ORAL_TABLET | Freq: Three times a day (TID) | ORAL | Status: DC

## 2013-11-26 MED ORDER — BOOST / RESOURCE BREEZE PO LIQD
1.0000 | Freq: Two times a day (BID) | ORAL | Status: DC
  Administered 2013-11-26: 1 via ORAL

## 2013-11-26 NOTE — Progress Notes (Signed)
NUTRITION FOLLOW UP  Intervention:   -30 ml Prostat daily, to provide 200 kcals and 30 grams protein -Provided bland diet education and discussed ways to improve protein intake  Nutrition Dx:   Inadequate oral intake related to abdominal pain and nausea/vomiting as evidenced by reported wt loss; onoging  Goal:   Pt to meet >/= 90% of their estimated nutrition needs   Monitor:   Weight trend, po intake, acceptance of supplements, diet advancement  Assessment:   28 y.o. black female Admitted wi37th right flank and generalized abdominal pain with persistent nausea and vomiting.  Pt reports frustration that her appetite is not improving, which she attributes to the antibiotics. She reveals that she has a "strong" appetite PTA. Documented meal intake is 10-50%, which is improving. Pt reports she a a full bowl of cereal for breakfast this AM. Also noted some outside food in room. She has been advanced to a regular diet.  She confirms that [employer] Breeze supplement was discontinued due to her disliking the flavor. She is willing to try other supplements, but reveals that she is vegan. However, she reports that she is willing to liberalize her food preferences to get the nutrition she needs. Reviewed formulary with her and she agrees to try Prostat.  She has multiple questions regarding her diet at discharge. Reviewed bland diet principles. Also discussed importance of protein consumption to promote healing and to preserve lean body mass.   Labs reviewed. BUN: 5, Calcium: 8.3.   Height: Ht Readings from Last 1 Encounters:  11/20/13 6' (1.829 m)    Weight Status:   Wt Readings from Last 1 Encounters:  11/25/13 135 lb 11.2 oz (61.553 kg)   11/22/13 136 lb 14.4 oz (62.097 kg)   Re-estimated needs:  Kcal: 1800-2000 Protein: 74-84 grams Fluid: 1.8-2.0 L  Skin: WDL  Diet Order: Diet regular   Intake/Output Summary (Last 24 hours) at 11/26/13 1047 Last data filed at 11/26/13 0934  Gross per 24 hour  Intake    840 ml  Output      2 ml  Net    838 ml    Last BM: 11/23/13  Labs:   Recent Labs Lab 11/20/13 0149 11/20/13 0553  NA 138 140  K 3.6* 4.1  CL 100 104  CO2 23 21  BUN 5* 5*  CREATININE 0.72 0.72  CALCIUM 9.3 8.3*  GLUCOSE 100* 91    CBG (last 3)  No results for input(s): GLUCAP in the last 72 hours.  Scheduled Meds: . amoxicillin-clavulanate  1 tablet Oral BID  . ciprofloxacin  500 mg Oral Q12H  . diclofenac sodium  4 g Topical QID  . enoxaparin (LOVENOX) injection  40 mg Subcutaneous Q24H  . famotidine  20 mg Oral BID  . pantoprazole  40 mg Oral BID  . sodium chloride  3 mL Intravenous Q12H  . sucralfate  1 g Oral TID WC & HS    Continuous Infusions: . sodium chloride 75 mL/hr at 11/25/13 2200    Lennyn Gange A. Mayford KnifeWilliams, RD, LDN Pager: [phone]

## 2013-11-26 NOTE — Progress Notes (Signed)
Patient discharged to home with instructions and verbalized understanding. 

## 2013-11-26 NOTE — Discharge Summary (Signed)
Physician Discharge Summary  Danielle [name] ZOX:[id number] DOB: July 13, 1985 DOA: 11/20/2013  PCP: Lolita Patella, MD  Admit date: 11/20/2013 Discharge date: 11/26/2013  Time spent: 35 minutes  Recommendations for Outpatient Follow-up:  1. Please follow up on BMP and CBC on hospital follow up  Discharge Diagnoses:  Active Problems:   Abdominal pain   Acute esophagitis   Acute pyelonephritis   Abdominal pain, acute   Dehydration   Nausea and vomiting in adult   Discharge Condition: Stable/Improved  Diet recommendation: Regular diet  Filed Weights   11/22/13 0507 11/24/13 0502 11/25/13 0515  Weight: 62.097 kg (136 lb 14.4 oz) 63.73 kg (140 lb 8 oz) 61.553 kg (135 lb 11.2 oz)    History of present illness:  Danielle [name] is a 28 y.o. female with Past medical history of right pyelonephritis. The patient presented with complaints of right-sided flank pain and fever and chills that has been ongoing since today on 11/19/2013 progressively worsening. She also had nausea and vomiting. With this she went to see her PCP who gave her Cipro and Augmentin but due to recurrent nausea and vomiting she was unable to tolerate anything orally and therefore decided to come to the ER. She denies any vaginal discharge diarrhea constipation chest pain shortness of breath. She denies any cough. She denies any rash. She denies any recent procedure urological. She denies any high risk sexual behavior, or intrauterine devices.  The patient is coming from home. And at her baseline independent for most of her ADL.  Hospital Course:  Patient is a pleasant 28 year old female with a past medical history of pyelonephritis, who was admitted to the medicine service on 11/20/2013, where she presented with complaints of right-sided flank pain associated with a lace, fevers, chills, fatigue. Patient reporting multiple episodes of nausea and vomiting. She has been prescribed ciprofloxacin and Augmentin by  her primary care physician however was unable to tolerate this medication due to nausea and vomiting. She had a CT scan of abdomen and pelvis on 11/21/2013 which showed stable hypodensities within the liver likely representing cysts, no other focal abnormality seen. Symptoms however seem to be consistent with pyelonephritis. She was started on empiric IV antibiotic therapy with IV ceftriaxone and Zosyn, as well as IV fluids. Patient complaining of abdominal pain for which gastroenterology was consulted. As mentioned above CT scan of abdomen and pelvis not show acute abnormality. She was worked up with upper endoscopy, procedure performed on 11/22/2013 which showed findings suggestive of esophagitis. Mucosal stomach appear normal. Biopsies obtained from duodenum were unremarkable. Patient showed gradual clinical improvement as she was transitioned to oral Augmentin and Ciprofloxacin therapy. She was discharged to home in stable condition on 11/26/2013 as she remained afebrile and was tolerating by mouth intake.   Procedures:  EGD, procedure performed on 11/22/2013 by Dr. Randa Evens impression: Red mucosa in the distal esophagus above the GE junction. This probably represents esophageal reflux. Because of the stomach appeared normal. Biopsy from duodenum unremarkable for pathology.  Consultations:  Gastroenterology  Discharge Exam: Filed Vitals:   11/26/13 0613  BP: 117/71  Pulse: 67  Temp: 98.5 F (36.9 C)  Resp: 16    General: In no acute distress, awake alert oriented, nontoxic-appearing Cardiovascular: Regular rate and rhythm normal S1-S2 Respiratory: Clear to auscultation bilaterally no wheezing rhonchi or rales Abdomen: There is some mild tenderness to palpation across generalized abdomen  Discharge Instructions You were cared for by a hospitalist during your hospital stay. If you have any questions about  your discharge medications or the care you received while you were in the hospital  after you are discharged, you can call the unit and asked to speak with the hospitalist on call if the hospitalist that took care of you is not available. Once you are discharged, your primary care physician will handle any further medical issues. Please note that NO REFILLS for any discharge medications will be authorized once you are discharged, as it is imperative that you return to your primary care physician (or establish a relationship with a primary care physician if you do not have one) for your aftercare needs so that they can reassess your need for medications and monitor your lab values.  Discharge Instructions    Call MD for:  difficulty breathing, headache or visual disturbances    Complete by:  As directed      Call MD for:  extreme fatigue    Complete by:  As directed      Call MD for:  hives    Complete by:  As directed      Call MD for:  persistant dizziness or light-headedness    Complete by:  As directed      Call MD for:  persistant nausea and vomiting    Complete by:  As directed      Call MD for:  redness, tenderness, or signs of infection (pain, swelling, redness, odor or green/yellow discharge around incision site)    Complete by:  As directed      Call MD for:  severe uncontrolled pain    Complete by:  As directed      Call MD for:  temperature >100.4    Complete by:  As directed      Diet - low sodium heart healthy    Complete by:  As directed      Increase activity slowly    Complete by:  As directed           Current Discharge Medication List    START taking these medications   Details  diclofenac sodium (VOLTAREN) 1 % GEL Apply 4 g topically 4 (four) times daily. Qty: 110 g, Refills: 0    diphenoxylate-atropine (LOMOTIL) 2.5-0.025 MG per tablet Take 1 tablet by mouth 4 (four) times daily as needed for diarrhea or loose stools. Qty: 30 tablet, Refills: 0    pantoprazole (PROTONIX) 40 MG tablet Take 1 tablet (40 mg total) by mouth 2 (two) times  daily. Qty: 60 tablet, Refills: 0    sucralfate (CARAFATE) 1 G tablet Take 1 tablet (1 g total) by mouth 4 (four) times daily -  with meals and at bedtime. Qty: 30 tablet, Refills: 0      CONTINUE these medications which have CHANGED   Details  ondansetron (ZOFRAN-ODT) 8 MG disintegrating tablet Take 1 tablet (8 mg total) by mouth every 8 (eight) hours as needed for nausea. Qty: 20 tablet, Refills: 0    oxyCODONE (OXY IR/ROXICODONE) 5 MG immediate release tablet Take 1 tablet (5 mg total) by mouth every 6 (six) hours as needed for moderate pain or severe pain. Qty: 30 tablet, Refills: 0    ranitidine (ZANTAC) 300 MG tablet Take 1 tablet (300 mg total) by mouth 2 (two) times daily as needed for heartburn. Qty: 60 tablet, Refills: 0      CONTINUE these medications which have NOT CHANGED   Details  acetaminophen (TYLENOL) 325 MG tablet Take 325-650 mg by mouth every 6 (six) hours as needed  for moderate pain or fever.    amoxicillin-clavulanate (AUGMENTIN) 875-125 MG per tablet Take 1 tablet by mouth 2 (two) times daily. Treating UTI and Flank Pain    ciprofloxacin (CIPRO) 500 MG tablet Take 500 mg by mouth every 12 (twelve) hours. Treating UTI and Flank Pain    docusate sodium (COLACE) 100 MG capsule Take 100 mg by mouth daily as needed for mild constipation.    hyoscyamine (LEVSIN, ANASPAZ) 0.125 MG tablet Take 0.125-0.25 mg by mouth every 6 (six) hours as needed for cramping.  Refills: 0      STOP taking these medications     fluconazole (DIFLUCAN) 150 MG tablet        No Known Allergies Follow-up Information    Follow up with Lolita PatellaEADE,ROBERT ALEXANDER, MD In 1 week.   Specialty:  Family Medicine   Contact information:   [phone] [address] [place] [id number] [phone]        The results of significant diagnostics from this hospitalization (including imaging, microbiology, ancillary and laboratory) are listed below for reference.    Significant  Diagnostic Studies: [place] Abdomen Complete  11/04/2013   CLINICAL DATA:  Right upper quadrant pain  EXAM: ULTRASOUND ABDOMEN COMPLETE  COMPARISON:  None.  FINDINGS: Gallbladder: No gallstones or wall thickening visualized. No sonographic Murphy sign noted.  Common bile duct: Diameter: 3.1 mm  Liver: 1 x 1.5 x 0.9 cm hyperechoic mass near the dome of the liver, likely representing a hemangioma. There is no other focal hepatic lesion. Within normal limits in parenchymal echogenicity.  IVC: No abnormality visualized.  Pancreas: Visualized portion unremarkable.  Spleen: Size and appearance within normal limits.  Right Kidney: Length: 10.6 cm. Echogenicity within normal limits. No mass or hydronephrosis visualized.  Left Kidney: Length: 10.8 cm. Echogenicity within normal limits. No mass or hydronephrosis visualized.  Abdominal aorta: No aneurysm visualized.  Other findings: None.  IMPRESSION: 1. No cholelithiasis or sonographic evidence of acute cholecystitis.   Electronically Signed   By: Elige KoHetal  Patel   On: 11/04/2013 13:13   Ct Abdomen Pelvis W Contrast  11/21/2013   CLINICAL DATA:  Right-sided abdominal pain for 3 days  EXAM: CT ABDOMEN AND PELVIS WITH CONTRAST  TECHNIQUE: Multidetector CT imaging of the abdomen and pelvis was performed using the standard protocol following bolus administration of intravenous contrast.  CONTRAST:  80mL OMNIPAQUE IOHEXOL 300 MG/ML  SOLN  COMPARISON:  08/18/2009, 11/04/2013  FINDINGS: Lung bases are well aerated with very minimal left basilar atelectasis posteriorly.  The liver shows evidence of a few scattered hypodensities likely representing cysts but incompletely evaluated on this exam. They are however stable from the prior study in 2011. The gallbladder, spleen, adrenal glands and pancreas are all normal in their CT appearance. The kidneys demonstrate a normal enhancement pattern. No renal calculi or obstructive changes are seen.  The appendix is well filled with barium. The  bladder is well distended. No pelvic mass lesion is seen ovarian cystic changes are noted on the left. Minimal free fluid is noted within the pelvis likely physiologic in nature. No bony abnormality is noted.  IMPRESSION: Left ovarian cystic change.  Stable hypodensities within the liver likely representing cysts.  No other focal abnormality is seen.   Electronically Signed   By: Alcide CleverMark  Lukens M.D.   On: 11/21/2013 14:20   Dg Abd Acute W/chest  11/20/2013   CLINICAL DATA:  Abdominal pain.  EXAM: ACUTE ABDOMEN SERIES (ABDOMEN 2 VIEW & CHEST 1 VIEW)  COMPARISON:  CT abdomen and pelvis 08/18/2009  FINDINGS: Pulmonary hyperinflation. Normal heart size and pulmonary vascularity. No focal airspace disease or consolidation in the lungs. No blunting of costophrenic angles. No pneumothorax. Mediastinal contours appear intact.  Scattered gas and stool in the colon. No small or large bowel distention. No free intra-abdominal air. No abnormal air-fluid levels. No radiopaque stones. Visualized bones appear intact.  IMPRESSION: No evidence of active pulmonary disease. Nonobstructive bowel gas pattern.   Electronically Signed   By: Burman Nieves M.D.   On: 11/20/2013 02:55    Microbiology: Recent Results (from the past 240 hour(s))  Urine culture     Status: None   Collection Time: 11/20/13  2:06 AM  Result Value Ref Range Status   Specimen Description URINE, CLEAN CATCH  Final   Special Requests NONE  Final   Culture  Setup Time   Final    11/20/2013 11:02 Performed at [facility] Count   Final    9,000 COLONIES/ML Performed at [facility]    Culture   Final    INSIGNIFICANT GROWTH Performed at [facility]    Report Status 11/21/2013 FINAL  Final  Culture, blood (routine x 2)     Status: None   Collection Time: 11/20/13  6:08 AM  Result Value Ref Range Status   Specimen Description BLOOD RIGHT HAND  Final   Special Requests BOTTLES DRAWN AEROBIC ONLY 5CC  Final    Culture  Setup Time   Final    11/20/2013 11:00 Performed at [facility]    Culture   Final    NO GROWTH 5 DAYS Performed at [facility]    Report Status 11/26/2013 FINAL  Final  Culture, blood (routine x 2)     Status: None   Collection Time: 11/20/13  6:18 AM  Result Value Ref Range Status   Specimen Description BLOOD LEFT ARM  Final   Special Requests BOTTLES DRAWN AEROBIC AND ANAEROBIC 10CC EACH  Final   Culture  Setup Time   Final    11/20/2013 11:00 Performed at [facility]    Culture   Final    NO GROWTH 5 DAYS Performed at [facility]    Report Status 11/26/2013 FINAL  Final  Clostridium Difficile by PCR     Status: None   Collection Time: 11/23/13 12:04 AM  Result Value Ref Range Status   C difficile by pcr NEGATIVE NEGATIVE Final     Labs: Basic Metabolic Panel:  Recent Labs Lab 11/20/13 0149 11/20/13 0553  NA 138 140  K 3.6* 4.1  CL 100 104  CO2 23 21  GLUCOSE 100* 91  BUN 5* 5*  CREATININE 0.72 0.72  CALCIUM 9.3 8.3*   Liver Function Tests:  Recent Labs Lab 11/20/13 0149 11/20/13 0553  AST 23 22  ALT 17 16  ALKPHOS 68 59  BILITOT 1.2 0.7  PROT 8.0 7.1  ALBUMIN 4.1 3.5    Recent Labs Lab 11/21/13 1705  LIPASE 15   No results for input(s): AMMONIA in the last 168 hours. CBC:  Recent Labs Lab 11/20/13 0149 11/20/13 0553 11/23/13 0500 11/26/13 0424  WBC 9.9 7.7 5.2 4.2  NEUTROABS 7.6  --   --   --   HGB 12.2 11.5* 10.1* 11.3*  HCT 35.6* 35.3* 29.8* 32.9*  MCV 88.6 91.0 85.6 84.6  PLT 179 156 190 243   Cardiac Enzymes: No results for input(s):  CKTOTAL, CKMB, CKMBINDEX, TROPONINI in the last 168 hours. BNP: BNP (last 3 results) No results for input(s): PROBNP in the last 8760 hours. CBG: No results for input(s): GLUCAP in the last 168 hours.     SignedJeralyn Bennett  [facility] 11/26/2013, 11:01 AM

## 2015-06-29 IMAGING — CT CT ABD-PELV W/ CM
2 of 4 series · 16 of 46 positions shown, 18 images · IV contrast (APPLIED)
Comparison: 08/18/2009, 11/04/2013

CLINICAL DATA: Right-sided abdominal pain for 3 days

EXAM:
CT ABDOMEN AND PELVIS WITH CONTRAST
TECHNIQUE: Multidetector CT imaging of the abdomen and pelvis was performed
using the standard protocol following bolus administration of
intravenous contrast.
CONTRAST:  80mL OMNIPAQUE IOHEXOL 300 MG/ML  SOLN

[Series 2: abd/ pelvis 5.0 i30f 1 · axial · 0.61mm/px · z∈[-321,+119]mm · 13 of 96 slices shown, 15 images]
[im 4/96  soft-tissue]
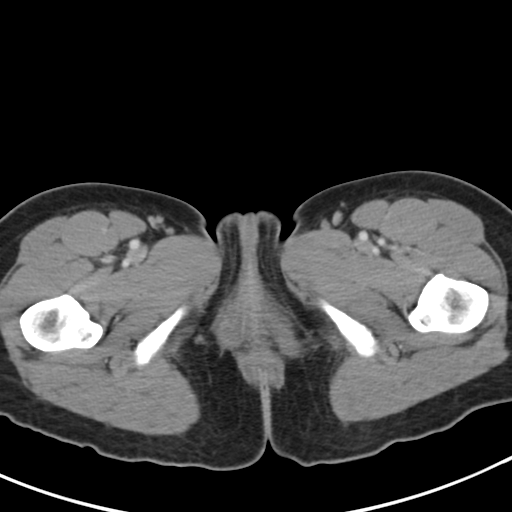
[im 4/96  bone]
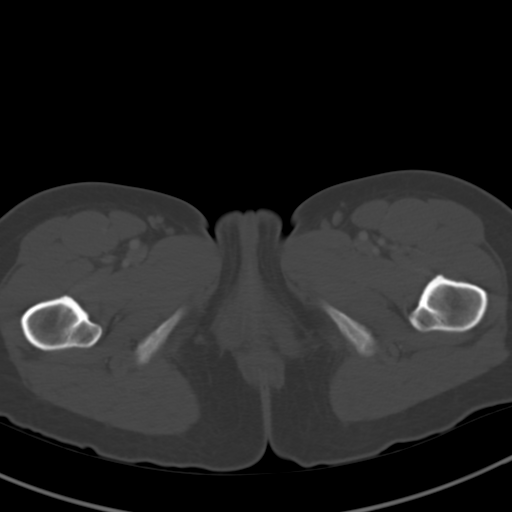
[im 11/96  soft-tissue]
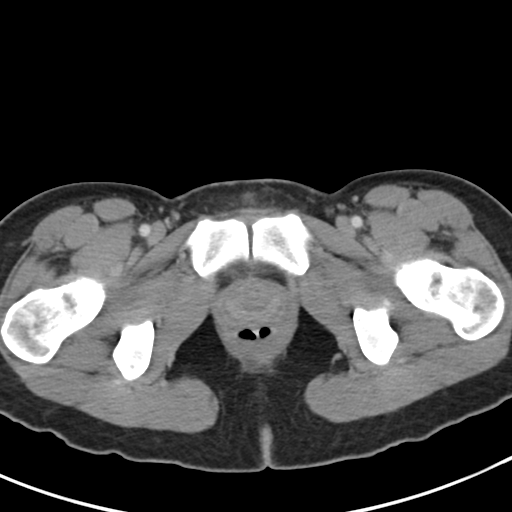
[im 19/96  soft-tissue]
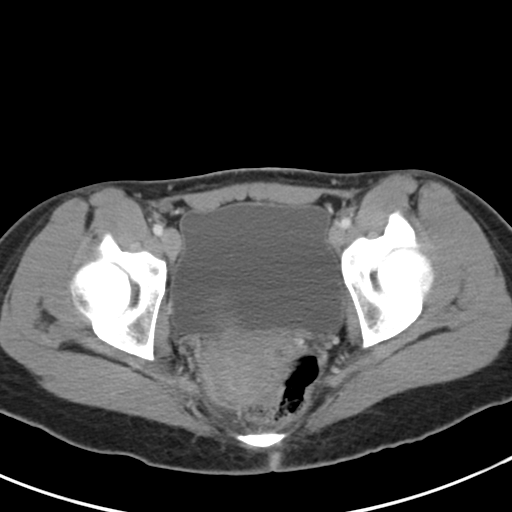
[im 26/96  soft-tissue]
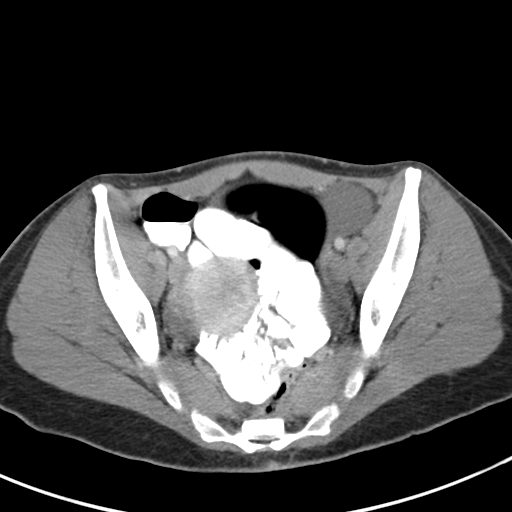
[im 33/96  soft-tissue]
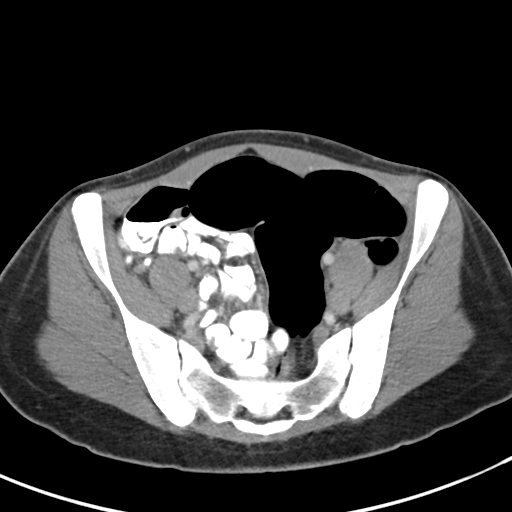
[im 41/96  soft-tissue]
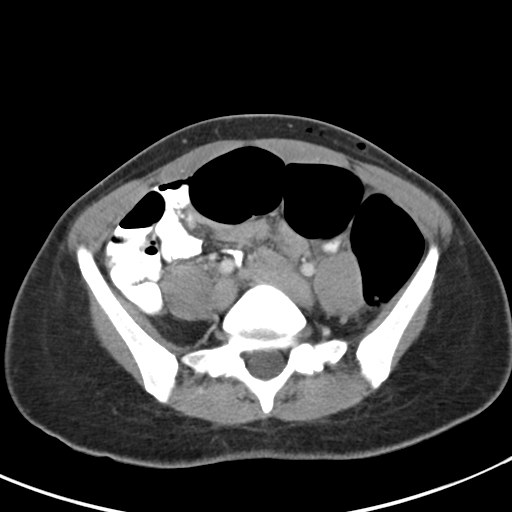
[im 48/96  soft-tissue]
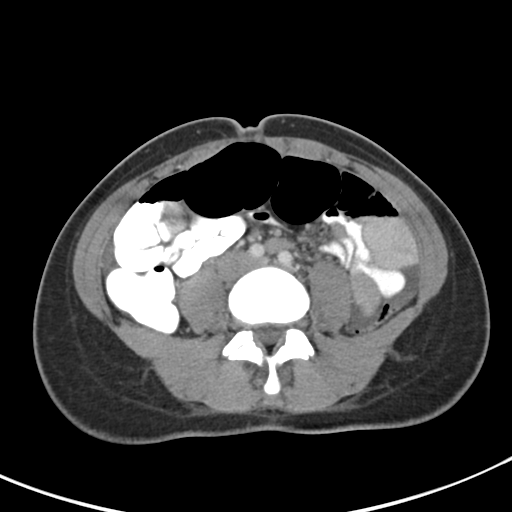
[im 55/96  soft-tissue]
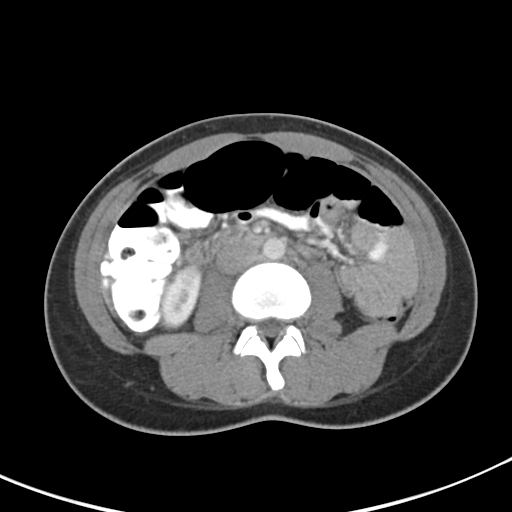
[im 63/96  soft-tissue]
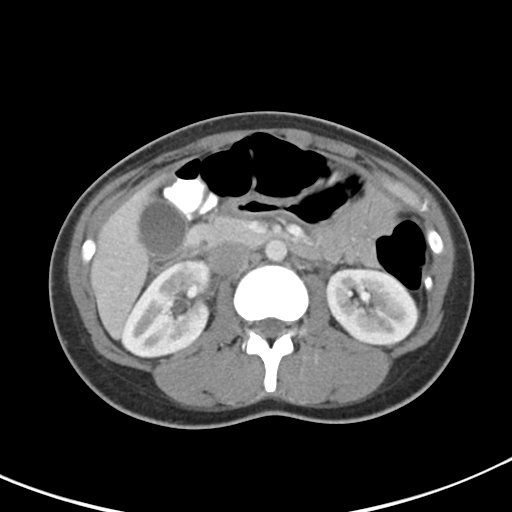
[im 63/96  bone]
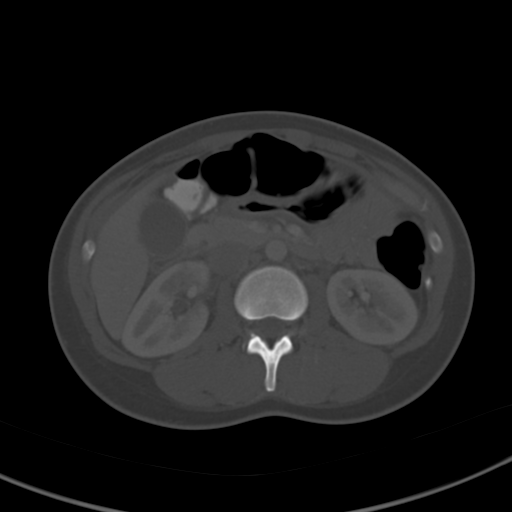
[im 70/96  soft-tissue]
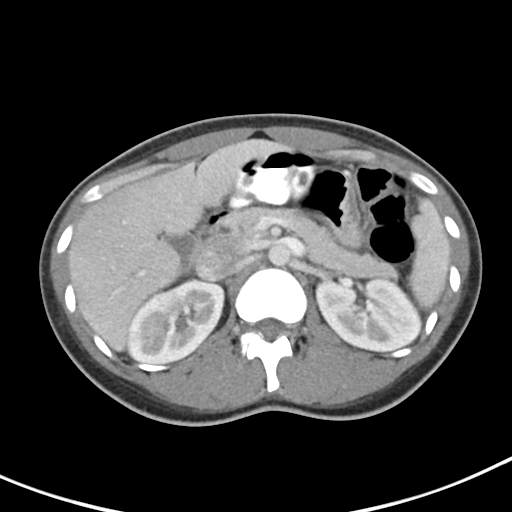
[im 77/96  soft-tissue]
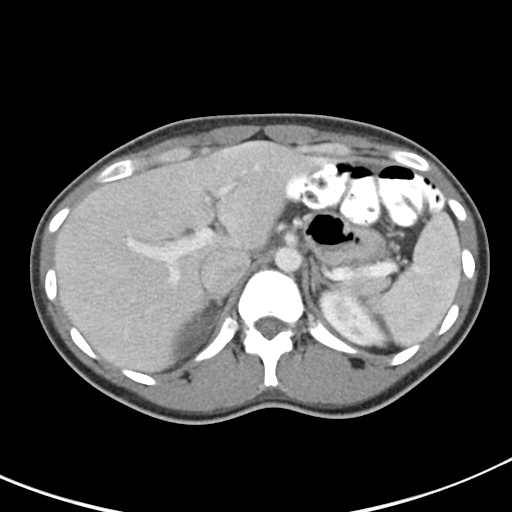
[im 85/96  soft-tissue]
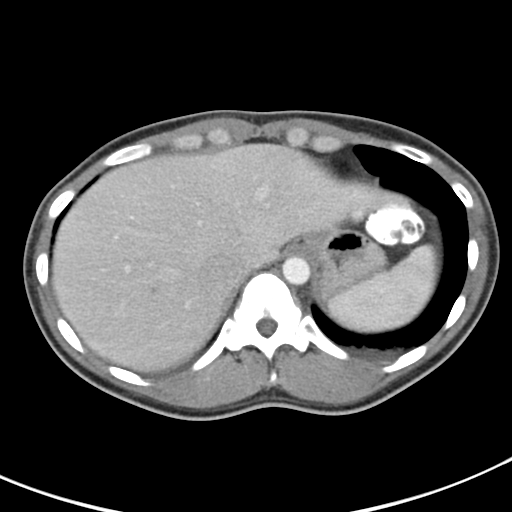
[im 92/96  soft-tissue]
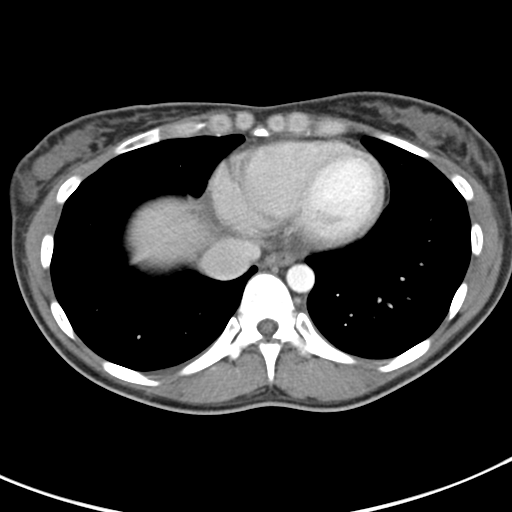

[Series 4: coronal soft tissue · coronal · 0.93mm/px · 3 of 119 slices shown]
[im 40/119  soft-tissue]
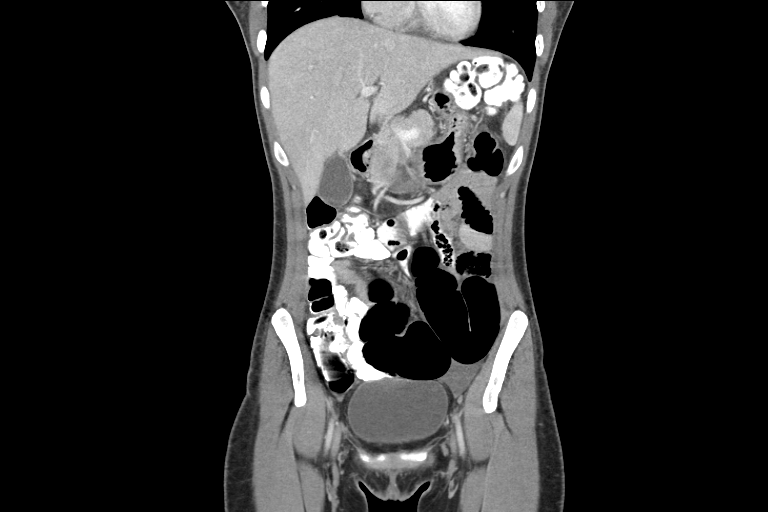
[im 53/119  soft-tissue]
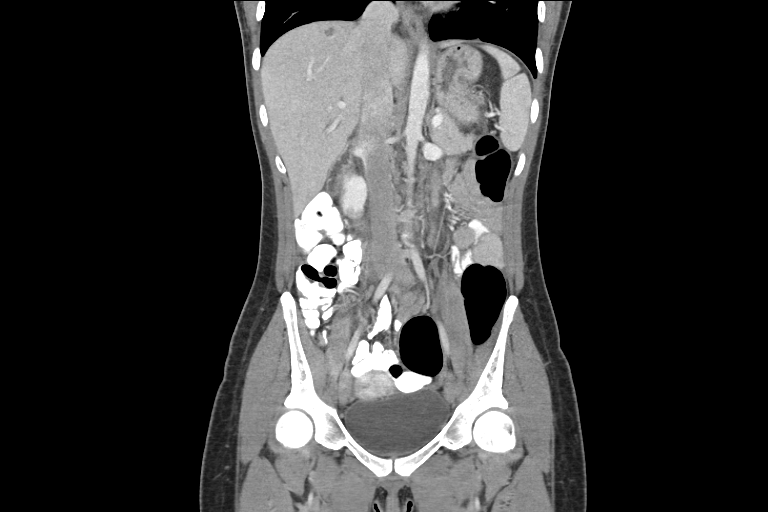
[im 66/119  soft-tissue]
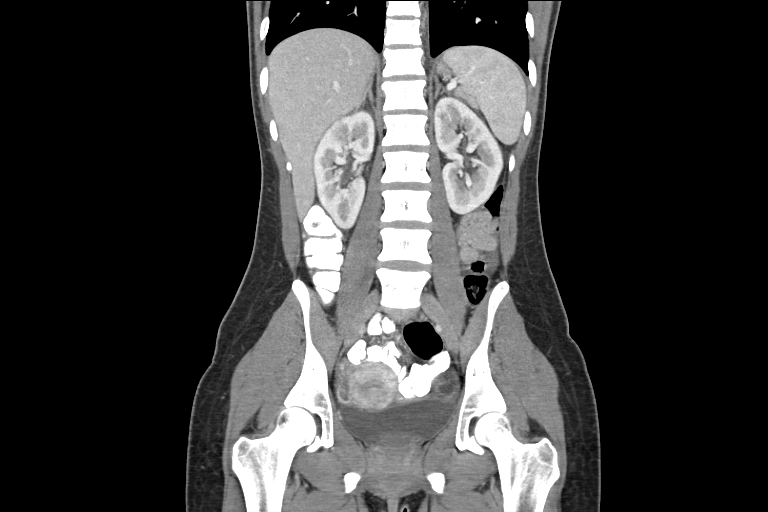

[16 of 46 positions shown; findings below may reference images not displayed]

FINDINGS: Lung bases are well aerated with very minimal left basilar
atelectasis posteriorly.

The liver shows evidence of a few scattered hypodensities likely
representing cysts but incompletely evaluated on this exam. They are
however stable from the prior study in 5266. The gallbladder,
spleen, adrenal glands and pancreas are all normal in their CT
appearance. The kidneys demonstrate a normal enhancement pattern. No
renal calculi or obstructive changes are seen.

The appendix is well filled with barium. The bladder is well
distended. No pelvic mass lesion is seen ovarian cystic changes are
noted on the left. Minimal free fluid is noted within the pelvis
likely physiologic in nature. No bony abnormality is noted.
IMPRESSION: Left ovarian cystic change.

Stable hypodensities within the liver likely representing cysts.

No other focal abnormality is seen.

## 2019-08-11 ENCOUNTER — Ambulatory Visit: Payer: BC Managed Care – PPO | Attending: Internal Medicine

## 2019-08-11 DIAGNOSIS — Z20822 Contact with and (suspected) exposure to covid-19: Secondary | ICD-10-CM

## 2019-08-12 LAB — NOVEL CORONAVIRUS, NAA: SARS-CoV-2, NAA: NOT DETECTED

## 2019-08-12 LAB — SARS-COV-2, NAA 2 DAY TAT

## 2023-04-28 ENCOUNTER — Emergency Department (HOSPITAL_COMMUNITY)
Admission: EM | Admit: 2023-04-28 | Discharge: 2023-04-28 | Disposition: A | Discharge status: Home / Self Care | Attending: Emergency Medicine | Admitting: Emergency Medicine

## 2023-04-28 ENCOUNTER — Other Ambulatory Visit: Payer: Self-pay

## 2023-04-28 ENCOUNTER — Emergency Department (HOSPITAL_COMMUNITY)

## 2023-04-28 ENCOUNTER — Encounter (HOSPITAL_COMMUNITY): Payer: Self-pay

## 2023-04-28 DIAGNOSIS — H538 Other visual disturbances: Secondary | ICD-10-CM | POA: Diagnosis not present

## 2023-04-28 DIAGNOSIS — R252 Cramp and spasm: Secondary | ICD-10-CM | POA: Diagnosis not present

## 2023-04-28 DIAGNOSIS — R200 Anesthesia of skin: Secondary | ICD-10-CM | POA: Diagnosis present

## 2023-04-28 DIAGNOSIS — R202 Paresthesia of skin: Secondary | ICD-10-CM | POA: Diagnosis not present

## 2023-04-28 MED ORDER — GADOBUTROL 1 MMOL/ML IV SOLN
6.0000 mL | Freq: Once | INTRAVENOUS | Status: AC | PRN
  Administered 2023-04-28: 6 mL via INTRAVENOUS

## 2023-04-28 NOTE — ED Provider Notes (Signed)
 [place] [facility]MENT AT [facility] Provider Note   CSN: [id number] Arrival date & time: 04/28/23  1708     History  Chief Complaint  Patient presents with   Numbness    Danielle [name] is a 38 y.o. female with no significant past medical history presents here due to concerns for MS.  Patient states that she has had 2 episodes over the last 2 weeks or she is experienced transient visual changes with her left eye, described as blurred vision that lasts for about 30 minutes.  She also states for the past 2 years, she gets intermittent paresthesias to her bilateral lower extremities, most notable after sitting down and only last for few seconds.  Her cousin was diagnosed with MS, and patient wanted to ensure that she is not developing similar symptoms.  She also endorses intermittent cramps to the back as well as a sensation of foggy headedness intermittently.  HPI     Home Medications Prior to Admission medications   Medication Sig Start Date End Date Taking? Authorizing Provider  acetaminophen  (TYLENOL ) 325 MG tablet Take 325-650 mg by mouth every 6 (six) hours as needed for moderate pain or fever.    [provider]  amoxicillin -clavulanate (AUGMENTIN ) 875-125 MG per tablet Take 1 tablet by mouth 2 (two) times daily. Treating UTI and Flank Pain    [provider]  ciprofloxacin  (CIPRO ) 500 MG tablet Take 500 mg by mouth every 12 (twelve) hours. Treating UTI and Flank Pain    [provider]  diclofenac  sodium (VOLTAREN ) 1 % GEL Apply 4 g topically 4 (four) times daily. 11/25/13   Rizwan, Saima, MD  diphenoxylate -atropine  (LOMOTIL ) 2.5-0.025 MG per tablet Take 1 tablet by mouth 4 (four) times daily as needed for diarrhea or loose stools. 11/25/13   Rizwan, Saima, MD  docusate sodium  (COLACE) 100 MG capsule Take 100 mg by mouth daily as needed for mild constipation.    [provider]  hyoscyamine  (LEVSIN, ANASPAZ ) 0.125 MG tablet  Take 0.125-0.25 mg by mouth every 6 (six) hours as needed for cramping.  11/03/13   [provider]  ondansetron  (ZOFRAN -ODT) 8 MG disintegrating tablet Take 1 tablet (8 mg total) by mouth every 8 (eight) hours as needed for nausea. 11/25/13   Rizwan, Saima, MD  oxyCODONE  (OXY IR/ROXICODONE ) 5 MG immediate release tablet Take 1 tablet (5 mg total) by mouth every 6 (six) hours as needed for moderate pain or severe pain. 11/25/13   Rizwan, Saima, MD  pantoprazole  (PROTONIX ) 40 MG tablet Take 1 tablet (40 mg total) by mouth 2 (two) times daily. 11/25/13   Rizwan, Saima, MD  ranitidine  (ZANTAC ) 300 MG tablet Take 1 tablet (300 mg total) by mouth 2 (two) times daily as needed for heartburn. 11/25/13   Rizwan, Saima, MD  sucralfate  (CARAFATE ) 1 G tablet Take 1 tablet (1 g total) by mouth 4 (four) times daily -  with meals and at bedtime. 11/26/13   Zamora, Ezequiel, MD      Allergies    Egg-derived products    Review of Systems   Review of Systems  Physical Exam Updated Vital Signs BP 105/70 (BP Location: Left Arm)   Pulse 83   Temp 98.3 F (36.8 C) (Oral)   Resp 18   Ht 6' (1.829 m)   Wt 62.6 kg   LMP 03/21/2023   SpO2 96%   BMI 18.72 kg/m  Physical Exam Vitals and nursing note reviewed.  Constitutional:  General: She is not in acute distress.    Appearance: She is well-developed.  HENT:     Head: Normocephalic and atraumatic.  Eyes:     Conjunctiva/sclera: Conjunctivae normal.  Cardiovascular:     Rate and Rhythm: Normal rate and regular rhythm.     Heart sounds: No murmur heard. Pulmonary:     Effort: Pulmonary effort is normal. No respiratory distress.     Breath sounds: Normal breath sounds.  Abdominal:     General: Abdomen is flat. There is no distension.     Palpations: Abdomen is soft.     Tenderness: There is no abdominal tenderness. There is no guarding or rebound.  Genitourinary:    Comments: Right groin with 2 cm palpable firmness with slight  darkening of skin but no erythema or crepitus Musculoskeletal:        General: No swelling.     Cervical back: Neck supple.  Skin:    General: Skin is warm and dry.     Capillary Refill: Capillary refill takes less than 2 seconds.  Neurological:     General: No focal deficit present.     Mental Status: She is alert and oriented to person, place, and time. Mental status is at baseline.     Cranial Nerves: No cranial nerve deficit.     Motor: No weakness.     Coordination: Coordination normal.     Gait: Gait normal.     Comments: 5/5 strength in bilateral upper and lower extremities.  Normal FTN.  Intact sensation bilaterally.  Psychiatric:        Mood and Affect: Mood normal.     ED Results / Procedures / Treatments   Labs (all labs ordered are listed, but only abnormal results are displayed) Labs Reviewed - No data to display  EKG None  Radiology MR Brain W and Wo Contrast Result Date: 04/28/2023 CLINICAL DATA:  Initial evaluation for transient left-sided visual loss. EXAM: MRI HEAD AND ORBITS WITHOUT AND WITH CONTRAST TECHNIQUE: Multiplanar, multiecho pulse sequences of the brain and surrounding structures were obtained without and with intravenous contrast. Multiplanar, multiecho pulse sequences of the orbits and surrounding structures were obtained including fat saturation techniques, before and after intravenous contrast administration. CONTRAST:  6mL GADAVIST  GADOBUTROL  1 MMOL/ML IV SOLN COMPARISON:  None Available. FINDINGS: MRI HEAD FINDINGS Brain: Cerebral volume within normal limits. Few subcentimeter foci of T2/FLAIR hyperintensity are seen involving the juxtacortical white matter of the left cerebral hemisphere (series 7, images 27, 19), nonspecific, but overall minimal in nature. Overall appearance would not be typical for demyelinating disease. No evidence for acute or subacute infarct. Gray-white matter relation maintained. No areas of chronic cortical infarction. No acute  or chronic intracranial blood products. No mass lesion, midline shift or mass effect. No hydrocephalus or extra-axial fluid collection. Pituitary gland within normal limits. No abnormal enhancement. Vascular: Major intracranial vascular flow voids are maintained. Skull and upper cervical spine: Craniocervical junction within normal limits. Bone marrow signal intensity normal. No scalp soft tissue abnormality. Other: No mastoid effusion. MRI ORBITS FINDINGS Orbits: Globes are symmetric in size with normal appearance and morphology. Optic nerves symmetric and within normal limits. No abnormal optic nerve edema or enhancement to suggest acute optic neuritis. No abnormality about either optic nerve sheath complex. Extra-ocular muscles symmetric and within normal limits. Lacrimal glands normal. Intraconal and extraconal fat well-maintained. No abnormality about the orbital apices or cavernous sinus. Optic chiasm normally situated within the suprasellar cistern. Visualized sinuses: Visualized  paranasal sinuses are largely clear. Soft tissues: Unremarkable. IMPRESSION: MRI HEAD: 1. Few subcentimeter foci of T2/FLAIR hyperintensity involving the juxtacortical white matter of the left cerebral hemisphere, nonspecific, but overall minimal in nature. Overall appearance would not be typical for demyelinating disease. 2. Otherwise normal brain MRI. MRI ORBITS: Normal MRI of the orbits. No findings to explain patient's symptoms identified. Electronically Signed   By: Virgia Griffins M.D.   On: 04/28/2023 22:41   MR ORBITS W WO CONTRAST Result Date: 04/28/2023 CLINICAL DATA:  Initial evaluation for transient left-sided visual loss. EXAM: MRI HEAD AND ORBITS WITHOUT AND WITH CONTRAST TECHNIQUE: Multiplanar, multiecho pulse sequences of the brain and surrounding structures were obtained without and with intravenous contrast. Multiplanar, multiecho pulse sequences of the orbits and surrounding structures were obtained  including fat saturation techniques, before and after intravenous contrast administration. CONTRAST:  6mL GADAVIST  GADOBUTROL  1 MMOL/ML IV SOLN COMPARISON:  None Available. FINDINGS: MRI HEAD FINDINGS Brain: Cerebral volume within normal limits. Few subcentimeter foci of T2/FLAIR hyperintensity are seen involving the juxtacortical white matter of the left cerebral hemisphere (series 7, images 27, 19), nonspecific, but overall minimal in nature. Overall appearance would not be typical for demyelinating disease. No evidence for acute or subacute infarct. Gray-white matter relation maintained. No areas of chronic cortical infarction. No acute or chronic intracranial blood products. No mass lesion, midline shift or mass effect. No hydrocephalus or extra-axial fluid collection. Pituitary gland within normal limits. No abnormal enhancement. Vascular: Major intracranial vascular flow voids are maintained. Skull and upper cervical spine: Craniocervical junction within normal limits. Bone marrow signal intensity normal. No scalp soft tissue abnormality. Other: No mastoid effusion. MRI ORBITS FINDINGS Orbits: Globes are symmetric in size with normal appearance and morphology. Optic nerves symmetric and within normal limits. No abnormal optic nerve edema or enhancement to suggest acute optic neuritis. No abnormality about either optic nerve sheath complex. Extra-ocular muscles symmetric and within normal limits. Lacrimal glands normal. Intraconal and extraconal fat well-maintained. No abnormality about the orbital apices or cavernous sinus. Optic chiasm normally situated within the suprasellar cistern. Visualized sinuses: Visualized paranasal sinuses are largely clear. Soft tissues: Unremarkable. IMPRESSION: MRI HEAD: 1. Few subcentimeter foci of T2/FLAIR hyperintensity involving the juxtacortical white matter of the left cerebral hemisphere, nonspecific, but overall minimal in nature. Overall appearance would not be typical  for demyelinating disease. 2. Otherwise normal brain MRI. MRI ORBITS: Normal MRI of the orbits. No findings to explain patient's symptoms identified. Electronically Signed   By: Virgia Griffins M.D.   On: 04/28/2023 22:41    Procedures Procedures    Medications Ordered in ED Medications  gadobutrol  (GADAVIST ) 1 MMOL/ML injection 6 mL (6 mLs Intravenous Contrast Given 04/28/23 2116)  gadobutrol  (GADAVIST ) 1 MMOL/ML injection 6 mL (6 mLs Intravenous Contrast Given 04/28/23 2117)    ED Course/ Medical Decision Making/ A&P                                 Medical Decision Making Amount and/or Complexity of Data Reviewed Radiology: ordered.  Risk Prescription drug management.   Patient is alert, afebrile, and hemodynamically stable in the ED in no acute distress.  Physical exam as noted above with no acute neurologic deficits noted.  Patient does not currently have any complaints at this time, not concern for ocular pathology such as retinal detachment, vitreous detachment, or vitreous hemorrhage.  Neurologic symptoms are not consistent with TIA.  Will obtain MRI imaging to assess for signs of MS.   MRI resulted with few subcentimeter foci in the white matter of the left cerebral hemisphere that are nonspecific and minimal.  Not typical for demyelinating disease.  MRI orbits were also normal, reassuring against optic neuritis.  I briefly spoke with neurology about MRI findings, which they do not find acutely concerning for MS or severe pathology.  Patient stable for outpatient follow-up.  Referrals placed to neurology.  I spoke with patient about reassuring imaging results, as well as nonspecific findings for which do not require any emergent intervention.  She is agreeable to following up outpatient.  Discussed with patient about following up with her PCP for continued monitoring and management.  Strict return precautions were given, and patient was discharged in stable condition.  Patient  seen in conjunction with Dr. Val Garin, who agreed with the above work-up and plan of care.        Final Clinical Impression(s) / ED Diagnoses Final diagnoses:  Paresthesia    Rx / DC Orders ED Discharge Orders          Ordered    Ambulatory referral to Neurology       Comments: An appointment is requested in approximately: 2 weeks   04/28/23 2257              Lorain Robson, MD 04/29/23 4098    Mozell Arias, MD 05/05/23 1343

## 2023-04-28 NOTE — ED Triage Notes (Signed)
 Pt POV d/t wanting an MRI.  Pt states she has had numbness in feet, backs spasms and at time blurred vision in left eye for pat 6 months.  She was seen at [facility] and was told to come here to get an MRI to r/o MS.

## 2023-04-28 NOTE — Discharge Instructions (Signed)
 You were seen here for MRI to assess for any signs of MS.  This was reassuring against MS, and placed referral for neurology for continued follow-up given minor findings noted on MRI imaging.  Please return to the ED for further emergency medical care.

## 2023-04-30 ENCOUNTER — Encounter: Payer: Self-pay | Admitting: Neurology

## 2023-04-30 ENCOUNTER — Ambulatory Visit: Admitting: Neurology

## 2023-04-30 VITALS — BP 111/75 | HR 73 | Ht 72.0 in | Wt 143.0 lb

## 2023-04-30 DIAGNOSIS — R202 Paresthesia of skin: Secondary | ICD-10-CM | POA: Insufficient documentation

## 2023-04-30 DIAGNOSIS — H538 Other visual disturbances: Secondary | ICD-10-CM | POA: Insufficient documentation

## 2023-04-30 NOTE — Progress Notes (Signed)
 Chief Complaint  Patient presents with   New Patient (Initial Visit)    Pt in 15, here alone  Pt is referred for transient visual changes of the left eye and paresthesias of bilateral lower extremities.       ASSESSMENT AND PLAN  Danielle [name] is a 38 y.o. female   Transient blurry vision after sleep, intermittent headaches,  Essentially normal MRI of the brain, few scattered gliosis, not typical for demyelinating disease  Continue observe for symptoms return to clinic for new issues  DIAGNOSTIC DATA (LABS, IMAGING, TESTING) - I reviewed patient records, labs, notes, testing and imaging myself where available.   MEDICAL HISTORY:  Danielle [name], is a 38 year old female seen in request by emergency room for evaluation of transient left blurry vision, foot paresthesia, initial evaluation April 30, 2023  History is obtained from the patient and review of electronic medical records. I personally reviewed pertinent available imaging films in PACS.   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, preparing for wedding, has a lot of stress  Over past 6 months, she had 2 episode of transient blurry vision, most recent episode was in April 2025, woke up from nap, noticed mild left eye blurry vision last for 10 minutes, there was no associated headache  In addition she has intermittent numbness in toes, after sitting in the toilet for a long time,  She has intermittent back muscle spasm,  She denied history of headache, now has intermittent headaches over the past 2 weeks  She was sent to emergency room by primary care, had MRI of the brain and orbit with without contrast on April 28, 2023, we personally reviewed the film, few scattered gliosis, no acute abnormality   PHYSICAL EXAM:   Vitals:   04/30/23 1057  BP: 111/75  Pulse: 73  Weight: 143 lb (64.9 kg)  Height: 6' (1.829 m)   Body mass index is 19.39 kg/m.  PHYSICAL EXAMNIATION:  Gen: NAD,  conversant, well nourised, well groomed                     Cardiovascular: Regular rate rhythm, no peripheral edema, warm, nontender. Eyes: Conjunctivae clear without exudates or hemorrhage Neck: Supple, no carotid bruits. Pulmonary: Clear to auscultation bilaterally   NEUROLOGICAL EXAM:  MENTAL STATUS: Speech/cognition: Awake, alert, oriented to history taking and casual conversation CRANIAL NERVES: CN II: Visual fields are full to confrontation. Pupils are round equal and briskly reactive to light. CN III, IV, VI: extraocular movement are normal. No ptosis. CN V: Facial sensation is intact to light touch CN VII: Face is symmetric with normal eye closure  CN VIII: Hearing is normal to causal conversation. CN IX, X: Phonation is normal. CN XI: Head turning and shoulder shrug are intact  MOTOR: There is no pronator drift of out-stretched arms. Muscle bulk and tone are normal. Muscle strength is normal.  REFLEXES: Reflexes are 2+ and symmetric at the biceps, triceps, knees, and ankles. Plantar responses are flexor.  SENSORY: Intact to light touch, pinprick and vibratory sensation are intact in fingers and toes.  COORDINATION: There is no trunk or limb dysmetria noted.  GAIT/STANCE: Posture is normal. Gait is steady with normal steps, base, arm swing, and turning. Heel and toe walking are normal. Tandem gait is normal.  Romberg is absent.  REVIEW OF SYSTEMS:  Full 14 system review of systems performed and notable only for as above All other review of systems were negative.  ALLERGIES: Allergies  Allergen Reactions   Egg-Derived Products Nausea And Vomiting    HOME MEDICATIONS: Current Outpatient Medications  Medication Sig Dispense Refill   ciprofloxacin (CIPRO) 500 MG/5ML (10%) suspension Take by mouth 2 (two) times daily. Do NOT administer via tube     No current facility-administered medications for this visit.    PAST MEDICAL HISTORY: Past Medical History:   Diagnosis Date   Acute renal failure (HCC)    Dorsal wrist ganglion 03/2011   right   Pleural effusion, bilateral 10/30/13   and compressive atx on CT chest at [facility].    Protein calorie malnutrition (HCC) 10/2013   Pyelonephritis 10/2013   right, treated at [facility]. urine grew pan sensitive e coli.    Thrombocytopenia (HCC) 10/2013   low of 104 10/24/2013    PAST SURGICAL HISTORY: Past Surgical History:  Procedure Laterality Date   ESOPHAGOGASTRODUODENOSCOPY N/A 11/22/2013   Procedure: ESOPHAGOGASTRODUODENOSCOPY (EGD);  Surgeon: Venson Ginger., MD;  Location: [facility] ENDOSCOPY;  Service: Endoscopy;  Laterality: N/A;   GANGLION CYST EXCISION  04/20/2011   Procedure: REMOVAL GANGLION OF WRIST;  Surgeon: Amelie Baize., MD;  Location: [facility];  Service: Orthopedics;  Laterality: Right;  right dorsal wrist    FAMILY HISTORY: History reviewed. No pertinent family history.  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  Tobacco Use   Smoking status: Never   Smokeless tobacco: Never  Vaping Use   Vaping status: Never Used  Substance and Sexual Activity   Alcohol use: Yes    Alcohol/week: 2.0 standard drinks of alcohol    Types: 1 Glasses of wine, 1 Shots of liquor per week    Comment: rare   Drug use: No   Sexual activity: Not on file  Other Topics Concern   Not on file  Social History Narrative   Not on file   Social Drivers of Health   Financial Resource Strain: Not on file  Food Insecurity: Not on file  Transportation Needs: Not on file  Physical Activity: Not on file  Stress: Not on file  Social Connections: Not on file  Intimate Partner Violence: Not on file      Phebe Brasil, M.D.  Ph.D.  [facility] [address] [place], [place] [address] Ph: [phone] Fax: [phone]  CC:  Mozell Arias, MD [address] [place],  [place] [address]  Vidal Graven, MD (Inactive)
